# Patient Record
Sex: Female | Born: 1960 | Race: White | Hispanic: No | Smoking: Never smoker
Health system: Southern US, Community
[De-identification: ages and names within clinical notes are randomized; demographics above are authoritative.]

## PROBLEM LIST (undated history)

## (undated) DIAGNOSIS — M1990 Unspecified osteoarthritis, unspecified site: Secondary | ICD-10-CM

## (undated) DIAGNOSIS — J40 Bronchitis, not specified as acute or chronic: Secondary | ICD-10-CM

## (undated) DIAGNOSIS — F32A Depression, unspecified: Secondary | ICD-10-CM

## (undated) DIAGNOSIS — E119 Type 2 diabetes mellitus without complications: Secondary | ICD-10-CM

## (undated) DIAGNOSIS — I729 Aneurysm of unspecified site: Secondary | ICD-10-CM

## (undated) DIAGNOSIS — G4730 Sleep apnea, unspecified: Secondary | ICD-10-CM

## (undated) DIAGNOSIS — I10 Essential (primary) hypertension: Secondary | ICD-10-CM

## (undated) DIAGNOSIS — R519 Headache, unspecified: Secondary | ICD-10-CM

## (undated) DIAGNOSIS — C801 Malignant (primary) neoplasm, unspecified: Secondary | ICD-10-CM

## (undated) DIAGNOSIS — I5189 Other ill-defined heart diseases: Secondary | ICD-10-CM

## (undated) DIAGNOSIS — K760 Fatty (change of) liver, not elsewhere classified: Secondary | ICD-10-CM

## (undated) HISTORY — PX: NASAL SINUS SURGERY: SHX719

## (undated) HISTORY — PX: NOSE SURGERY: SHX723

## (undated) HISTORY — PX: DIAGNOSTIC LAPAROSCOPY: SUR761

## (undated) HISTORY — PX: ABDOMINAL HYSTERECTOMY: SHX81

---

## 2004-03-14 ENCOUNTER — Ambulatory Visit: Payer: Self-pay

## 2004-03-17 ENCOUNTER — Ambulatory Visit: Payer: Self-pay

## 2005-04-24 ENCOUNTER — Ambulatory Visit: Payer: Self-pay

## 2006-05-07 ENCOUNTER — Ambulatory Visit: Payer: Self-pay

## 2007-07-01 ENCOUNTER — Ambulatory Visit: Payer: Self-pay

## 2009-01-20 ENCOUNTER — Ambulatory Visit: Payer: Self-pay

## 2010-04-18 ENCOUNTER — Ambulatory Visit: Payer: Self-pay

## 2014-11-15 ENCOUNTER — Other Ambulatory Visit: Payer: Self-pay | Admitting: Unknown Physician Specialty

## 2014-11-15 ENCOUNTER — Other Ambulatory Visit: Payer: Self-pay | Admitting: *Deleted

## 2014-11-15 ENCOUNTER — Inpatient Hospital Stay
Admission: RE | Admit: 2014-11-15 | Discharge: 2014-11-15 | Disposition: A | Discharge status: Home / Self Care | Payer: Self-pay | Source: Ambulatory Visit | Attending: *Deleted | Admitting: *Deleted

## 2014-11-15 DIAGNOSIS — R928 Other abnormal and inconclusive findings on diagnostic imaging of breast: Secondary | ICD-10-CM

## 2014-12-02 ENCOUNTER — Ambulatory Visit
Admission: RE | Admit: 2014-12-02 | Discharge: 2014-12-02 | Disposition: A | Discharge status: Home / Self Care | Payer: BC Managed Care – PPO | Source: Ambulatory Visit | Attending: Unknown Physician Specialty | Admitting: Unknown Physician Specialty

## 2014-12-02 DIAGNOSIS — N6002 Solitary cyst of left breast: Secondary | ICD-10-CM | POA: Diagnosis not present

## 2014-12-02 DIAGNOSIS — R928 Other abnormal and inconclusive findings on diagnostic imaging of breast: Secondary | ICD-10-CM

## 2014-12-02 HISTORY — DX: Malignant (primary) neoplasm, unspecified: C80.1

## 2015-03-30 ENCOUNTER — Ambulatory Visit: Payer: BC Managed Care – PPO | Attending: Specialist

## 2015-03-30 DIAGNOSIS — G4733 Obstructive sleep apnea (adult) (pediatric): Secondary | ICD-10-CM | POA: Insufficient documentation

## 2016-04-05 ENCOUNTER — Other Ambulatory Visit: Payer: Self-pay | Admitting: Internal Medicine

## 2016-04-05 DIAGNOSIS — Z1231 Encounter for screening mammogram for malignant neoplasm of breast: Secondary | ICD-10-CM

## 2016-04-26 ENCOUNTER — Ambulatory Visit
Admission: RE | Admit: 2016-04-26 | Discharge: 2016-04-26 | Disposition: A | Discharge status: Home / Self Care | Payer: BC Managed Care – PPO | Source: Ambulatory Visit | Attending: Internal Medicine | Admitting: Internal Medicine

## 2016-04-26 DIAGNOSIS — Z1231 Encounter for screening mammogram for malignant neoplasm of breast: Secondary | ICD-10-CM | POA: Diagnosis present

## 2017-01-01 DIAGNOSIS — J189 Pneumonia, unspecified organism: Secondary | ICD-10-CM

## 2017-01-01 HISTORY — DX: Pneumonia, unspecified organism: J18.9

## 2017-02-16 ENCOUNTER — Inpatient Hospital Stay
Admission: EM | Admit: 2017-02-16 | Discharge: 2017-02-17 | DRG: 871 | Disposition: A | Discharge status: Home / Self Care | Payer: BC Managed Care – PPO | Attending: Internal Medicine | Admitting: Internal Medicine

## 2017-02-16 ENCOUNTER — Emergency Department: Payer: BC Managed Care – PPO

## 2017-02-16 ENCOUNTER — Other Ambulatory Visit: Payer: Self-pay

## 2017-02-16 ENCOUNTER — Inpatient Hospital Stay: Payer: BC Managed Care – PPO

## 2017-02-16 DIAGNOSIS — Z886 Allergy status to analgesic agent status: Secondary | ICD-10-CM | POA: Diagnosis not present

## 2017-02-16 DIAGNOSIS — J069 Acute upper respiratory infection, unspecified: Secondary | ICD-10-CM

## 2017-02-16 DIAGNOSIS — J181 Lobar pneumonia, unspecified organism: Secondary | ICD-10-CM | POA: Diagnosis present

## 2017-02-16 DIAGNOSIS — R0602 Shortness of breath: Secondary | ICD-10-CM | POA: Diagnosis present

## 2017-02-16 DIAGNOSIS — Z91012 Allergy to eggs: Secondary | ICD-10-CM | POA: Diagnosis not present

## 2017-02-16 DIAGNOSIS — E860 Dehydration: Secondary | ICD-10-CM | POA: Diagnosis present

## 2017-02-16 DIAGNOSIS — Z6836 Body mass index (BMI) 36.0-36.9, adult: Secondary | ICD-10-CM

## 2017-02-16 DIAGNOSIS — A419 Sepsis, unspecified organism: Secondary | ICD-10-CM

## 2017-02-16 DIAGNOSIS — R0902 Hypoxemia: Secondary | ICD-10-CM | POA: Diagnosis present

## 2017-02-16 DIAGNOSIS — E669 Obesity, unspecified: Secondary | ICD-10-CM | POA: Diagnosis present

## 2017-02-16 DIAGNOSIS — J189 Pneumonia, unspecified organism: Secondary | ICD-10-CM | POA: Diagnosis present

## 2017-02-16 DIAGNOSIS — F329 Major depressive disorder, single episode, unspecified: Secondary | ICD-10-CM | POA: Diagnosis present

## 2017-02-16 DIAGNOSIS — I10 Essential (primary) hypertension: Secondary | ICD-10-CM | POA: Diagnosis present

## 2017-02-16 HISTORY — DX: Essential (primary) hypertension: I10

## 2017-02-16 LAB — LACTIC ACID, PLASMA
LACTIC ACID, VENOUS: 1.6 mmol/L (ref 0.5–1.9)
LACTIC ACID, VENOUS: 2.3 mmol/L — AB (ref 0.5–1.9)

## 2017-02-16 LAB — BASIC METABOLIC PANEL
ANION GAP: 9 (ref 5–15)
BUN: 12 mg/dL (ref 6–20)
CALCIUM: 9.4 mg/dL (ref 8.9–10.3)
CO2: 27 mmol/L (ref 22–32)
Chloride: 102 mmol/L (ref 101–111)
Creatinine, Ser: 0.68 mg/dL (ref 0.44–1.00)
Glucose, Bld: 154 mg/dL — ABNORMAL HIGH (ref 65–99)
Potassium: 3.9 mmol/L (ref 3.5–5.1)
SODIUM: 138 mmol/L (ref 135–145)

## 2017-02-16 LAB — CBC
HCT: 40.6 % (ref 35.0–47.0)
HEMOGLOBIN: 14 g/dL (ref 12.0–16.0)
MCH: 30.7 pg (ref 26.0–34.0)
MCHC: 34.4 g/dL (ref 32.0–36.0)
MCV: 89.4 fL (ref 80.0–100.0)
PLATELETS: 238 10*3/uL (ref 150–440)
RBC: 4.54 MIL/uL (ref 3.80–5.20)
RDW: 13.9 % (ref 11.5–14.5)
WBC: 13.3 10*3/uL — AB (ref 3.6–11.0)

## 2017-02-16 LAB — INFLUENZA PANEL BY PCR (TYPE A & B)
INFLAPCR: NEGATIVE
INFLBPCR: NEGATIVE

## 2017-02-16 LAB — PROTIME-INR
INR: 1.02
Prothrombin Time: 13.3 seconds (ref 11.4–15.2)

## 2017-02-16 LAB — TROPONIN I

## 2017-02-16 LAB — APTT: APTT: 31 s (ref 24–36)

## 2017-02-16 LAB — PROCALCITONIN

## 2017-02-16 MED ORDER — SODIUM CHLORIDE 0.9 % IV SOLN
1.0000 g | INTRAVENOUS | Status: DC
  Administered 2017-02-16: 1 g via INTRAVENOUS
  Filled 2017-02-16 (×2): qty 10

## 2017-02-16 MED ORDER — SODIUM CHLORIDE 0.9 % IV BOLUS (SEPSIS)
1000.0000 mL | Freq: Once | INTRAVENOUS | Status: DC
  Administered 2017-02-16: 1000 mL via INTRAVENOUS

## 2017-02-16 MED ORDER — SODIUM CHLORIDE 0.9 % IV SOLN
INTRAVENOUS | Status: DC
  Administered 2017-02-16: 23:00:00 via INTRAVENOUS

## 2017-02-16 MED ORDER — ONDANSETRON HCL 4 MG/2ML IJ SOLN
4.0000 mg | Freq: Once | INTRAMUSCULAR | Status: AC
  Administered 2017-02-16: 4 mg via INTRAVENOUS
  Filled 2017-02-16: qty 2

## 2017-02-16 MED ORDER — ACETAMINOPHEN 500 MG PO TABS
1000.0000 mg | ORAL_TABLET | ORAL | Status: AC
  Administered 2017-02-16: 1000 mg via ORAL
  Filled 2017-02-16: qty 2

## 2017-02-16 MED ORDER — SODIUM CHLORIDE 0.9 % IV BOLUS (SEPSIS): 1000.0000 mL | Freq: Once | INTRAVENOUS | Status: DC

## 2017-02-16 MED ORDER — LEVOFLOXACIN IN D5W 750 MG/150ML IV SOLN
750.0000 mg | Freq: Once | INTRAVENOUS | Status: AC
  Administered 2017-02-16: 750 mg via INTRAVENOUS
  Filled 2017-02-16: qty 150

## 2017-02-16 MED ORDER — ENOXAPARIN SODIUM 40 MG/0.4ML ~~LOC~~ SOLN
40.0000 mg | SUBCUTANEOUS | Status: DC
  Administered 2017-02-16: 40 mg via SUBCUTANEOUS
  Filled 2017-02-16: qty 0.4

## 2017-02-16 MED ORDER — SODIUM CHLORIDE 0.9 % IV BOLUS (SEPSIS): 500.0000 mL | Freq: Once | INTRAVENOUS | Status: DC

## 2017-02-16 MED ORDER — IPRATROPIUM-ALBUTEROL 0.5-2.5 (3) MG/3ML IN SOLN: 3.0000 mL | RESPIRATORY_TRACT | Status: DC | PRN

## 2017-02-16 MED ORDER — SODIUM CHLORIDE 0.9 % IV SOLN
500.0000 mg | INTRAVENOUS | Status: DC
  Filled 2017-02-16: qty 500

## 2017-02-16 MED ORDER — SODIUM CHLORIDE 0.9 % IV BOLUS (SEPSIS)
1000.0000 mL | Freq: Once | INTRAVENOUS | Status: AC
  Administered 2017-02-16: 1000 mL via INTRAVENOUS

## 2017-02-16 MED ORDER — GUAIFENESIN ER 600 MG PO TB12
600.0000 mg | ORAL_TABLET | Freq: Two times a day (BID) | ORAL | Status: DC
  Administered 2017-02-16 – 2017-02-17 (×2): 600 mg via ORAL
  Filled 2017-02-16 (×2): qty 1

## 2017-02-16 NOTE — ED Notes (Addendum)
Pt from [facility] clinic - was told she needed iv fluids for dehydration. Tested negative for flu but given rx for tamiflu, zofran and prednisone. Pt received iv fluids in triage. And a shot of phenergan at [place].

## 2017-02-16 NOTE — ED Provider Notes (Signed)
[facility] Emergency Department Provider Note   ____________________________________________   First MD Initiated Contact with Patient 02/16/17 1803     (approximate)  I have reviewed the triage vital signs and the nursing notes.   HISTORY  Chief Complaint Dehydration    HPI Sheila Rose is a 57 y.o. female reports a questionable history of COPD being worked up at [facility]  For the last week she has had a cough runny nose productive cough nasal congestion.  This been ongoing for a week but she has had increasing feeling of shortness of breath and generalized fatigue vomited twice in the last 2 days after coughing fits.  She reports he just feels very unwell, lightheaded when she sits up.  We can increase fatigue.  She reports she has had a pneumonia a long time ago and this felt similar.  Reports a mild headache, generalized body aches, fatigue.  Currently reports mild to moderate discomfort.  No recent antibiotics.  Seen in urgent care today and referred here for looking "dehydrated"  Past Medical History:  Diagnosis Date  . Cancer ([place])    skin  . Hypertension     There are no active problems to display for this patient.   Past Surgical History:  Procedure Laterality Date  . ABDOMINAL HYSTERECTOMY    . NOSE SURGERY      Prior to Admission medications   Not on File    Allergies Indomethacin  Family History  Problem Relation Age of Onset  . Breast cancer Maternal Aunt 52  . Breast cancer Paternal Aunt 59    Social History Social History   Tobacco Use  . Smoking status: Never Smoker  Substance Use Topics  . Alcohol use: No    Frequency: Never  . Drug use: Not on file    Review of Systems Constitutional: Fevers and chills eyes: No visual changes. ENT: No sore throat. Cardiovascular: Denies chest pain. Respiratory: See HPI Gastrointestinal: No abdominal pain.  No nausea, no vomiting.  Genitourinary: Negative for  dysuria. Musculoskeletal: Negative for back pain. Skin: Negative for rash. Neurological: Negative for headaches, focal weakness or numbness.    ____________________________________________   PHYSICAL EXAM:  VITAL SIGNS: ED Triage Vitals  Enc Vitals Group     BP 02/16/17 1502 (!) 133/96     Pulse Rate 02/16/17 1502 (!) 114     Resp 02/16/17 1502 18     Temp 02/16/17 1502 100.1 F (37.8 C)     Temp Source 02/16/17 1502 Oral     SpO2 02/16/17 1502 97 %     Weight 02/16/17 1503 224 lb (101.6 kg)     Height 02/16/17 1503 5\' 6"  (1.676 m)     Head Circumference --      Peak Flow --      Pain Score 02/16/17 1502 6     Pain Loc --      Pain Edu? --      Excl. in [place]? --     Constitutional: Alert and oriented.  In no acute distress, but appears diaphoretic, generally ill.  Very pleasant. Eyes: Conjunctivae are normal. Head: Atraumatic. Nose: Mild clear rhinorrhea Mouth/Throat: Mucous membranes are rattly dry Neck: No stridor.   Cardiovascular: Tachycardic rate, regular rhythm. Grossly normal heart sounds.  Good peripheral circulation. Respiratory: Minimal tachypnea.  Clear lung sounds except for some slight rales in the left lower lung fields.  Speaks in full sentences. Gastrointestinal: Soft and nontender. No distention. Musculoskeletal: No  lower extremity tenderness nor edema. Neurologic:  Normal speech and language. No gross focal neurologic deficits are appreciated.  Skin:  Skin is warm, dry and intact. No rash noted. Psychiatric: Mood and affect are normal. Speech and behavior are normal.  ____________________________________________   LABS (all labs ordered are listed, but only abnormal results are displayed)  Labs Reviewed  BASIC METABOLIC PANEL - Abnormal; Notable for the following components:      Result Value   Glucose, Bld 154 (*)    All other components within normal limits  CBC - Abnormal; Notable for the following components:   WBC 13.3 (*)    All other  components within normal limits  CULTURE, BLOOD (ROUTINE X 2)  CULTURE, BLOOD (ROUTINE X 2)  TROPONIN I  LACTIC ACID, PLASMA  LACTIC ACID, PLASMA  INFLUENZA PANEL BY PCR (TYPE A & B)   ____________________________________________  EKG   ____________________________________________  RADIOLOGY    Chest x-ray reviewed, mild opacification over the left lingular region.  Possible atelectasis versus infiltrate ____________________________________________   PROCEDURES  Procedure(s) performed: None  Procedures  Critical Care performed: No  ____________________________________________   INITIAL IMPRESSION / ASSESSMENT AND PLAN / ED COURSE  Pertinent labs & imaging results that were available during my care of the patient were reviewed by me and considered in my medical decision making (see chart for details).  Patient presents for evaluation of fatigue, shortness of breath productive cough.  Noted to be febrile tachycardic, elevated white count.  Chest x-ray and exam suggestive of left lingular pneumonia, rales are noted in the fields.  She is awake and alert, but appears generally ill, consideration of her fever, mild hypoxia with a saturation 86% on room air we will start her on treatment for community-acquired pneumonia she has no noted risk factors for healthcare associated pneumonia, also suspect this may be post viral in nature.  We will retest her flu swab, negative urgent care but her symptoms initially highly suggestive of acute viral illness now leading possibly to pneumonia.  Clinical Course as of Feb 16 1854  Sat Feb 16, 2017  1848 O2 sat noted 86% on room air.  Patient denies increased dyspnea, but placed on 2 L with improvement.  Concern for left lower lobe pneumonia, sepsis.  Patient will be admitted to the hospital.  Lactic acid, blood cultures pending  [MQ]    Clinical Course User Index [MQ] Delman Kitten, MD      ____________________________________________   FINAL CLINICAL IMPRESSION(S) / ED DIAGNOSES  Final diagnoses:  Community acquired pneumonia of left lower lobe of lung ([place])  Sepsis, due to unspecified organism ([place])  Upper respiratory tract infection, unspecified type      NEW MEDICATIONS STARTED DURING THIS VISIT:  New Prescriptions   No medications on file     Note:  This document was prepared using Dragon voice recognition software and may include unintentional dictation errors.     Delman Kitten, MD 02/16/17 2141

## 2017-02-16 NOTE — ED Triage Notes (Signed)
Pt brought over from [facility] walk in and was told she needed IVF for dehydration. Pt states since last Sunday she has had cough, congestion, HA, N&V, HA. Tmax 101 at home. Pt has wet wash cloth to face. Had negative flu at [facility]. Was given IM phenergan at [facility].

## 2017-02-16 NOTE — ED Notes (Signed)
Notified Nurse Manuela Schwartz of assigned bed.

## 2017-02-16 NOTE — Progress Notes (Signed)
CODE SEPSIS - PHARMACY COMMUNICATION  **Broad Spectrum Antibiotics should be administered within 1 hour of Sepsis diagnosis**  Time Code Sepsis Called/Page Received:  02/16 @ 19:00   Antibiotics Ordered:  Levaquin   Time of 1st antibiotic administration:   Additional action taken by pharmacy: Called RN , reminded her to start IV levaquin   If necessary, Name of Provider/Nurse Contacted:     Verdella Laidlaw D ,PharmD Clinical Pharmacist  02/16/2017  7:42 PM

## 2017-02-16 NOTE — H&P (Signed)
[place] [facility] [place] NAME: Sheila Rose    MR#:  [id number]  DATE OF BIRTH:  17-Dec-1960  DATE OF ADMISSION:  02/16/2017  PRIMARY CARE PHYSICIAN: Tanda Rockers, MD   REQUESTING/REFERRING PHYSICIAN:   CHIEF COMPLAINT:   Chief Complaint  Patient presents with  . Dehydration    HISTORY OF PRESENT ILLNESS: Sheila Rose  is a 57 y.o. female with a known history per below, presented with one-week history of not feeling well, generalized weakness, cough, headache, nausea, emesis, fever to 101, seen in [place] [facility]-sent over to the emergency room for acute dehydration/IV fluids, ER workup noted for leukocytosis with pneumonia on chest x-ray, influenza negative at [facility] clinic, patient noted to be hypoxic with O2 saturation in the 80s-breathing comfortable on oxygen, patient evaluated emergency room with family, patient now been admitted for acute community-acquired pneumonia with associated sepsis.  PAST MEDICAL HISTORY:   Past Medical History:  Diagnosis Date  . Cancer ([place])    skin  . Hypertension     PAST SURGICAL HISTORY:  Past Surgical History:  Procedure Laterality Date  . ABDOMINAL HYSTERECTOMY    . NOSE SURGERY      SOCIAL HISTORY:  Social History   Tobacco Use  . Smoking status: Never Smoker  Substance Use Topics  . Alcohol use: No    Frequency: Never    FAMILY HISTORY:  Family History  Problem Relation Age of Onset  . Breast cancer Maternal Aunt 52  . Breast cancer Paternal Aunt 70  HTN  DRUG ALLERGIES:  Allergies  Allergen Reactions  . Eggs Or Egg-Derived Products Other (See Comments)    When it comes to FLU SHOT When it comes to FLU SHOT   . Indomethacin   . Nsaids     REVIEW OF SYSTEMS:   CONSTITUTIONAL: + fever, fatigue, and gen. weakness.  EYES: No blurred or double vision.  EARS, NOSE, AND THROAT: No tinnitus or ear pain.  RESPIRATORY: + cough, shortness of breath, no wheezing or  hemoptysis.  CARDIOVASCULAR: No chest pain, orthopnea, edema.  GASTROINTESTINAL: + nausea, vomiting, no diarrhea or abdominal pain.  GENITOURINARY: No dysuria, hematuria.  ENDOCRINE: No polyuria, nocturia,  HEMATOLOGY: No anemia, easy bruising or bleeding SKIN: No rash or lesion. MUSCULOSKELETAL: No joint pain or arthritis.   NEUROLOGIC: No tingling, numbness, weakness.  PSYCHIATRY: No anxiety or depression.   MEDICATIONS AT HOME:  Prior to Admission medications   Medication Sig Start Date End Date Taking? Authorizing Provider  cyclobenzaprine (FLEXERIL) 10 MG tablet Take 10 mg by mouth at bedtime as needed for muscle spasms. 01/02/17  Yes [provider]  fluticasone (FLONASE) 50 MCG/ACT nasal spray Place 1 spray into both nostrils daily. 02/01/17  Yes [provider]  hydrochlorothiazide (HYDRODIURIL) 25 MG tablet Take 25 mg by mouth daily. 01/02/17  Yes [provider]  sertraline (ZOLOFT) 100 MG tablet Take 100 mg by mouth 2 (two) times daily. 02/01/17  Yes [provider]      PHYSICAL EXAMINATION:   VITAL SIGNS: Blood pressure (!) 133/96, pulse (!) 114, temperature 100.1 F (37.8 C), temperature source Oral, resp. rate 18, height 5\' 6"  (1.676 m), weight 101.6 kg (224 lb), SpO2 97 %.  GENERAL:  57 y.o.-year-old patient lying in the bed with no acute distress.  Obese EYES: Pupils equal, round, reactive to light and accommodation. No scleral icterus. Extraocular muscles intact.  HEENT: Head atraumatic, normocephalic. Oropharynx and nasopharynx clear.  NECK:  Supple, no jugular venous distention. No thyroid enlargement, no tenderness.  LUNGS: Normal breath sounds bilaterally, no wheezing, rales,rhonchi or crepitation. No use of accessory muscles of respiration.  CARDIOVASCULAR: S1, S2 normal. No murmurs, rubs, or gallops.  ABDOMEN: Soft, nontender, nondistended. Bowel sounds present. No organomegaly or mass.  EXTREMITIES: No pedal edema, cyanosis, or  clubbing.  NEUROLOGIC: Cranial nerves II through XII are intact. MAES. Gait not checked.  PSYCHIATRIC: The patient is alert and oriented x 3.  SKIN: No obvious rash, lesion, or ulcer.   LABORATORY PANEL:   CBC Recent Labs  Lab 02/16/17 1503  WBC 13.3*  HGB 14.0  HCT 40.6  PLT 238  MCV 89.4  MCH 30.7  MCHC 34.4  RDW 13.9   ------------------------------------------------------------------------------------------------------------------  Chemistries  Recent Labs  Lab 02/16/17 1503  NA 138  K 3.9  CL 102  CO2 27  GLUCOSE 154*  BUN 12  CREATININE 0.68  CALCIUM 9.4   ------------------------------------------------------------------------------------------------------------------ estimated creatinine clearance is 92.2 mL/min (by C-G formula based on SCr of 0.68 mg/dL). ------------------------------------------------------------------------------------------------------------------ No results for input(s): TSH, T4TOTAL, T3FREE, THYROIDAB in the last 72 hours.  Invalid input(s): FREET3   Coagulation profile No results for input(s): INR, PROTIME in the last 168 hours. ------------------------------------------------------------------------------------------------------------------- No results for input(s): DDIMER in the last 72 hours. -------------------------------------------------------------------------------------------------------------------  Cardiac Enzymes Recent Labs  Lab 02/16/17 1503  TROPONINI <0.03   ------------------------------------------------------------------------------------------------------------------ Invalid input(s): POCBNP  ---------------------------------------------------------------------------------------------------------------  Urinalysis No results found for: COLORURINE, APPEARANCEUR, LABSPEC, PHURINE, GLUCOSEU, HGBUR, BILIRUBINUR, KETONESUR, PROTEINUR, UROBILINOGEN, NITRITE, LEUKOCYTESUR   RADIOLOGY: Dg Chest 2  View  Result Date: 02/16/2017 CLINICAL DATA:  Dehydration with recent cough, congestion, nausea and vomiting with headache and fever. EXAM: CHEST  2 VIEW COMPARISON:  None. FINDINGS: Lungs are adequately inflated with linear density over the left base likely atelectasis. Minimal heterogeneous opacification in the region of the lingula which may be due to additional atelectasis or infection. No evidence of effusion. Cardiomediastinal silhouette and remainder the exam is unremarkable. IMPRESSION: Hypoinflation with mild opacification over the lingula some of which is linear as findings may be all due to atelectasis, although infection is possible. Recommend follow-up to resolution. Electronically Signed   By: Marin Olp M.D.   On: 02/16/2017 15:45    EKG: Orders placed or performed during the hospital encounter of 02/16/17  . ED EKG 12-Lead  . ED EKG 12-Lead    IMPRESSION AND PLAN: 1 acute sepsis secondary to community acquired pneumonia Start sepsis protocol, IV fluids for rehydration, follow-up on cultures  2 acute left-sided community-acquired pneumonia Start pneumonia protocol, empiric Rocephin/azithromycin, follow-up on cultures  3 chronic obstructive sleep apnea Stable Continue CPAP  4 incomplete MAR Complete when available  5 chronic benign essential hypertension Complete MAR when available, hydralazine IV as needed systolic blood pressure greater than 160, vitals per routine, make changes as per necessary  Full code Condition stable Disposition Home in 2-3 days barring any complications  All the records are reviewed and case discussed with ED provider. Management plans discussed with the patient, family and they are in agreement.  CODE STATUS: Code Status History    This patient does not have a recorded code status. Please follow your organizational policy for patients in this situation.       TOTAL TIME TAKING CARE OF THIS PATIENT: 45 minutes.    Avel Peace  Salary M.D on 02/16/2017   Between 7am to 6pm - Pager - [phone]  After 6pm go to [email or web address] - password  [email or web address]  [facility]  Office  [phone]  CC: Primary care physician; Tanda Rockers, MD   Note: This dictation was prepared with Dragon dictation along with smaller phrase technology. Any transcriptional errors that result from this process are unintentional.

## 2017-02-17 LAB — LACTIC ACID, PLASMA
Lactic Acid, Venous: 0.8 mmol/L (ref 0.5–1.9)
Lactic Acid, Venous: 1 mmol/L (ref 0.5–1.9)

## 2017-02-17 MED ORDER — TOPIRAMATE 25 MG PO TABS
50.0000 mg | ORAL_TABLET | Freq: Two times a day (BID) | ORAL | Status: DC
  Administered 2017-02-17: 50 mg via ORAL
  Filled 2017-02-17: qty 2

## 2017-02-17 MED ORDER — ACETAMINOPHEN 325 MG PO TABS
650.0000 mg | ORAL_TABLET | Freq: Four times a day (QID) | ORAL | Status: DC | PRN
  Administered 2017-02-17: 650 mg via ORAL
  Filled 2017-02-17: qty 2

## 2017-02-17 MED ORDER — CEFUROXIME AXETIL 500 MG PO TABS: 500.0000 mg | ORAL_TABLET | Freq: Two times a day (BID) | ORAL | 0 refills | Status: DC

## 2017-02-17 NOTE — Progress Notes (Signed)
Patient to start cpap tonight

## 2017-02-17 NOTE — Progress Notes (Signed)
Toshie A Gutt  A and O x 4. VSS. Pt tolerating diet well. No complaints of pain or nausea. IV removed intact, prescriptions given. Pt voiced understanding of discharge instructions with no further questions. Pt discharged via wheelchair with axillary.    Allergies as of 02/17/2017      Reactions   Eggs Or Egg-derived Products Other (See Comments)   When it comes to FLU SHOT When it comes to FLU SHOT   Indomethacin    Nsaids       Medication List    TAKE these medications   cefUROXime 500 MG tablet Commonly known as:  CEFTIN Take 1 tablet (500 mg total) by mouth 2 (two) times daily with a meal.   cyclobenzaprine 10 MG tablet Commonly known as:  FLEXERIL Take 10 mg by mouth at bedtime as needed for muscle spasms.   fluticasone 50 MCG/ACT nasal spray Commonly known as:  FLONASE Place 1 spray into both nostrils daily.   hydrochlorothiazide 25 MG tablet Commonly known as:  HYDRODIURIL Take 25 mg by mouth daily.   multivitamin tablet Take 1 tablet by mouth daily.   sertraline 100 MG tablet Commonly known as:  ZOLOFT Take 100 mg by mouth 2 (two) times daily.   vitamin B-12 1000 MCG tablet Commonly known as:  CYANOCOBALAMIN Take 1,000 mcg by mouth daily.   Vitamin D (Ergocalciferol) 50000 units Caps capsule Commonly known as:  DRISDOL Take 50,000 Units by mouth every Friday.       Vitals:   02/17/17 0435 02/17/17 1120  BP: 132/63   Pulse: 92   Resp: 20   Temp: 98 F (36.7 C)   SpO2: 94% 92%    Francesco Sor

## 2017-02-17 NOTE — Discharge Summary (Signed)
[facility] NAME: Sheila Rose    MR#:  [id number]  DATE OF BIRTH:  [birth year]  DATE OF ADMISSION:  02/16/2017 ADMITTING PHYSICIAN: Gorden Harms, MD  DATE OF DISCHARGE: 02/17/2017  PRIMARY CARE PHYSICIAN: Tanda Rockers, MD    ADMISSION DIAGNOSIS:  Sepsis ([place]) [A41.9] Sepsis, due to unspecified organism [facility]) [A41.9] Upper respiratory tract infection, unspecified type [J06.9] Community acquired pneumonia of left lower lobe of lung ([place]) [J18.1]  DISCHARGE DIAGNOSIS:  Active Problems:   CAP (community acquired pneumonia)   SECONDARY DIAGNOSIS:   Past Medical History:  Diagnosis Date  . Cancer ([place])    skin  . Hypertension     HOSPITAL COURSE:  57 year-old female with hypertension who presents with shortness of breath.  1. Sepsis: Patient presents with tachycardia, fever and leukocytosis Sepsis has resolved. Lactic acid has improved. Sepsis is due to community-acquired pneumonia  2. Community-acquired pneumonia: Patient was on Rocephin and azithromycin and will be transitioned to oral Ceftin.  3. Essential hypertension: Patient will resume outpatient regimen of HCTZ  4. Depression: Continue Zoloft   DISCHARGE CONDITIONS AND DIET:   Stable for discharge on regular diet  CONSULTS OBTAINED:    DRUG ALLERGIES:   Allergies  Allergen Reactions  . Eggs Or Egg-Derived Products Other (See Comments)    When it comes to FLU SHOT When it comes to FLU SHOT   . Indomethacin   . Nsaids     DISCHARGE MEDICATIONS:   Allergies as of 02/17/2017      Reactions   Eggs Or Egg-derived Products Other (See Comments)   When it comes to FLU SHOT When it comes to FLU SHOT   Indomethacin    Nsaids       Medication List    TAKE these medications   cefUROXime 500 MG tablet Commonly known as:  CEFTIN Take 1 tablet (500 mg total) by mouth 2 (two) times daily with a meal.   cyclobenzaprine 10 MG tablet Commonly  known as:  FLEXERIL Take 10 mg by mouth at bedtime as needed for muscle spasms.   fluticasone 50 MCG/ACT nasal spray Commonly known as:  FLONASE Place 1 spray into both nostrils daily.   hydrochlorothiazide 25 MG tablet Commonly known as:  HYDRODIURIL Take 25 mg by mouth daily.   multivitamin tablet Take 1 tablet by mouth daily.   sertraline 100 MG tablet Commonly known as:  ZOLOFT Take 100 mg by mouth 2 (two) times daily.   vitamin B-12 1000 MCG tablet Commonly known as:  CYANOCOBALAMIN Take 1,000 mcg by mouth daily.   Vitamin D (Ergocalciferol) 50000 units Caps capsule Commonly known as:  DRISDOL Take 50,000 Units by mouth every Friday.         Today   CHIEF COMPLAINT:   Patient has improved symptoms   VITAL SIGNS:  Blood pressure 132/63, pulse 92, temperature 98 F (36.7 C), temperature source Oral, resp. rate 20, height 5\' 6"  (1.676 m), weight 101.6 kg (224 lb), SpO2 94 %.   REVIEW OF SYSTEMS:  Review of Systems  Constitutional: Negative for chills, fever and malaise/fatigue.  HENT: Negative.  Negative for ear discharge, ear pain, hearing loss, nosebleeds and sore throat.   Eyes: Negative.  Negative for blurred vision and pain.  Respiratory: Positive for cough. Negative for hemoptysis, shortness of breath and wheezing.   Cardiovascular: Negative.  Negative for chest pain, palpitations and leg swelling.  Gastrointestinal: Negative.  Negative for  abdominal pain, blood in stool, diarrhea, nausea and vomiting.  Genitourinary: Negative.  Negative for dysuria.  Musculoskeletal: Negative.  Negative for back pain.  Skin: Negative.   Neurological: Positive for weakness. Negative for dizziness, tremors, speech change, focal weakness, seizures and headaches.  Endo/Heme/Allergies: Negative.  Does not bruise/bleed easily.  Psychiatric/Behavioral: Negative.  Negative for depression, hallucinations and suicidal ideas.     PHYSICAL EXAMINATION:  GENERAL:  57  y.o.-year-old patient lying in the bed with no acute distress.  NECK:  Supple, no jugular venous distention. No thyroid enlargement, no tenderness.  LUNGS: Normal breath sounds bilaterally, no wheezing, rales,rhonchi  No use of accessory muscles of respiration.  CARDIOVASCULAR: S1, S2 normal. No murmurs, rubs, or gallops.  ABDOMEN: Soft, non-tender, non-distended. Bowel sounds present. No organomegaly or mass.  EXTREMITIES: No pedal edema, cyanosis, or clubbing.  PSYCHIATRIC: The patient is alert and oriented x 3.  SKIN: No obvious rash, lesion, or ulcer.   DATA REVIEW:   CBC Recent Labs  Lab 02/16/17 1503  WBC 13.3*  HGB 14.0  HCT 40.6  PLT 238    Chemistries  Recent Labs  Lab 02/16/17 1503  NA 138  K 3.9  CL 102  CO2 27  GLUCOSE 154*  BUN 12  CREATININE 0.68  CALCIUM 9.4    Cardiac Enzymes Recent Labs  Lab 02/16/17 1503  TROPONINI <0.03    Microbiology Results  @MICRORSLT48 @  RADIOLOGY:  Dg Chest 2 View  Result Date: 02/16/2017 CLINICAL DATA:  Dehydration with recent cough, congestion, nausea and vomiting with headache and fever. EXAM: CHEST  2 VIEW COMPARISON:  None. FINDINGS: Lungs are adequately inflated with linear density over the left base likely atelectasis. Minimal heterogeneous opacification in the region of the lingula which may be due to additional atelectasis or infection. No evidence of effusion. Cardiomediastinal silhouette and remainder the exam is unremarkable. IMPRESSION: Hypoinflation with mild opacification over the lingula some of which is linear as findings may be all due to atelectasis, although infection is possible. Recommend follow-up to resolution. Electronically Signed   By: Marin Olp M.D.   On: 02/16/2017 15:45      Allergies as of 02/17/2017      Reactions   Eggs Or Egg-derived Products Other (See Comments)   When it comes to FLU SHOT When it comes to FLU SHOT   Indomethacin    Nsaids       Medication List    TAKE these  medications   cefUROXime 500 MG tablet Commonly known as:  CEFTIN Take 1 tablet (500 mg total) by mouth 2 (two) times daily with a meal.   cyclobenzaprine 10 MG tablet Commonly known as:  FLEXERIL Take 10 mg by mouth at bedtime as needed for muscle spasms.   fluticasone 50 MCG/ACT nasal spray Commonly known as:  FLONASE Place 1 spray into both nostrils daily.   hydrochlorothiazide 25 MG tablet Commonly known as:  HYDRODIURIL Take 25 mg by mouth daily.   multivitamin tablet Take 1 tablet by mouth daily.   sertraline 100 MG tablet Commonly known as:  ZOLOFT Take 100 mg by mouth 2 (two) times daily.   vitamin B-12 1000 MCG tablet Commonly known as:  CYANOCOBALAMIN Take 1,000 mcg by mouth daily.   Vitamin D (Ergocalciferol) 50000 units Caps capsule Commonly known as:  DRISDOL Take 50,000 Units by mouth every Friday.          Management plans discussed with the patient and she is in agreement. Stable for  discharge home  Patient should follow up with pcp  CODE STATUS:     Code Status Orders  (From admission, onward)        Start     Ordered   02/16/17 2135  Full code  Continuous     02/16/17 2134    Code Status History    Date Active Date Inactive Code Status Order ID Comments User Context   This patient has a current code status but no historical code status.      TOTAL TIME TAKING CARE OF THIS PATIENT: 38 minutes.    Note: This dictation was prepared with Dragon dictation along with smaller phrase technology. Any transcriptional errors that result from this process are unintentional.  Daeja Helderman M.D on 02/17/2017 at 11:07 AM  Between 7am to 6pm - Pager - [phone] After 6pm go to [email or web address] - password [email or web address] [facility]  Office  [phone]  CC: Primary care physician; Tanda Rockers, MD

## 2017-02-19 LAB — HIV ANTIBODY (ROUTINE TESTING W REFLEX): HIV SCREEN 4TH GENERATION: NONREACTIVE

## 2017-02-21 LAB — CULTURE, BLOOD (ROUTINE X 2)
CULTURE: NO GROWTH
Culture: NO GROWTH
SPECIAL REQUESTS: ADEQUATE

## 2017-04-16 ENCOUNTER — Other Ambulatory Visit: Payer: Self-pay | Admitting: Internal Medicine

## 2017-04-16 DIAGNOSIS — Z1231 Encounter for screening mammogram for malignant neoplasm of breast: Secondary | ICD-10-CM

## 2017-05-17 ENCOUNTER — Ambulatory Visit
Admission: RE | Admit: 2017-05-17 | Discharge: 2017-05-17 | Disposition: A | Discharge status: Home / Self Care | Payer: BC Managed Care – PPO | Source: Ambulatory Visit | Attending: Internal Medicine | Admitting: Internal Medicine

## 2017-05-17 DIAGNOSIS — Z1231 Encounter for screening mammogram for malignant neoplasm of breast: Secondary | ICD-10-CM

## 2017-07-26 ENCOUNTER — Ambulatory Visit: Payer: BC Managed Care – PPO | Admitting: Dietician

## 2017-08-13 ENCOUNTER — Telehealth: Payer: Self-pay | Admitting: Dietician

## 2017-08-13 ENCOUNTER — Encounter: Payer: Self-pay | Admitting: Dietician

## 2017-08-13 ENCOUNTER — Encounter: Payer: BC Managed Care – PPO | Attending: Internal Medicine | Admitting: Dietician

## 2017-08-13 VITALS — Ht 65.0 in | Wt 225.2 lb

## 2017-08-13 DIAGNOSIS — I10 Essential (primary) hypertension: Secondary | ICD-10-CM | POA: Diagnosis not present

## 2017-08-13 DIAGNOSIS — R0609 Other forms of dyspnea: Secondary | ICD-10-CM | POA: Diagnosis not present

## 2017-08-13 DIAGNOSIS — E669 Obesity, unspecified: Secondary | ICD-10-CM | POA: Diagnosis not present

## 2017-08-13 NOTE — Progress Notes (Signed)
Medical Nutrition Therapy: Visit start time: 1330 end time: 1440 Assessment:  Diagnosis: hypertension, obesity, other forms of dyspnea Past medical history:hyperlipidemia, sleep apnea, fibromyalgia, migraine Psychosocial issues/ stress concerns: Pt. Rates her stress as "low".   Preferred learning method:  . Visual Current weight: 225.2lbs Height: 65 in Medications, supplements: see list; takes Vit.D, B12,multi-vitamin and fish oil  Progress and evaluation:  Patient in for initial medical nutrition therapy appointment. She reports a strong family history of diabetes and heart disease. She states she has gained approximately 20 lbs in past year. Gives history of 40 lbs of weight loss in 2010 through diet and consistent exercise. 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 Her most recent lipid profile- TChol-235 (H), Trig.-305 (H), LDL 132 (H), Glucose-122 (unsure if it was fasting). More recently, she has made positive diet changes. She is making lower fat choices and has increased her vegetable intake.  Physical activity: no structured exercise  Dietary Intake:  Usual eating pattern includes 3 meals and 1-2 snacks per day. Dining out frequency:2-4 meals per week.  Breakfast: 7:30- oatmeal, fruit, 1 cup coffee with sweetened creamer or [place] [employer] [place] sausage/egg white sandwich  Lunch: 12:00- meat sandwich, baked chips or salad or cottage cheese and fruit.If eats "out", usually chooses a higher fat meal. Supper: 6-8:00pm- salads with tuna or chicken or [place] burger/baked beans or chicken with a variety of vegetables  Snack: ice cream or yogurt or popcorn Beverages: water 6-7cups daily, sweet tea, coffee (1 cup)  Nutrition Care Education:  Weight control:  Instructed on a meal plan based on 1600 calories including identifying carbohydrates, portion  control and how to better balance carbohydrate, protein and non-starchy vegetables. Encouraged to use her love of vegetables to her advantage to increase fiber and decrease portions of starch and fat at meals. Gave and reviewed sample menu.  Hypertension:  Instructed on sodium recommendations and ways to decrease in her diet. Hyperlipidemia:  target goals for lipids, healthy and unhealthy fats, role of fiber, recommendations for saturated fat and ways to lower in her diet; label reading related to saturated fat, sodium and carbohydrate.   Nutritional Diagnosis:  -3.3 Overweight/obesity As related to history of higher fat food choices and lack of structured exercise .  As evidenced by diet/exercise history.  Intervention:  Balance meals with 2-4 oz lean protein, 2-3 servings of carbohydrate and non-starchy vegetables.  Refer to food guide plate for diabetes and "Planning a Balanced Meal" Read labels for carbohydrate, saturated fat, and sodium. Guideline for  sodium is less than 1500 mg daily. Guideline for  saturated fat is less than 12 gms daily. Limit sugar sweetened beverages. Exercise- start with 10 minutes daily   Education Materials given:  . Plate Planner . Food lists/ Planning A Balanced Meal . Sample meal pattern/ menus . Goals/ instructions Learner/ who was taught:  . Patient  Level of understanding: . Partial understanding; needs review/ practice  Demonstrated degree of understanding via:   Teach back Learning barriers: . None Willingness to learn/ readiness for change: . Eager, change in progress  Monitoring and Evaluation:  Dietary intake, exercise,  and body weight      follow up: 09/10/17 at 1:15

## 2017-08-13 NOTE — Patient Instructions (Signed)
Balance meals with 2-4 oz lean protein, 2-3 servings of carbohydrate and non-starchy vegetables.  Refer to food guide plate for diabetes and "Planning a Balanced Meal" Read labels for carbohydrate, saturated fat, and sodium. Guideline for  sodium is less than 1500 mg daily. Guideline for  saturated fat is less than 12 gms daily. Limit sugar sweetened beverages. Exercise- start with 10 minutes daily

## 2017-08-13 NOTE — Telephone Encounter (Signed)
Called patient to clarify if MD had given her any instructions as to whether there were exercise limitations. Left message asking that she return call.

## 2017-08-15 ENCOUNTER — Telehealth: Payer: Self-pay | Admitting: Dietician

## 2017-08-15 NOTE — Telephone Encounter (Signed)
Called patient to clarify regarding her doctor's instruction regarding exercise and she stated she does not have any restrictions.

## 2017-09-10 ENCOUNTER — Ambulatory Visit: Payer: BC Managed Care – PPO | Admitting: Dietician

## 2017-09-26 ENCOUNTER — Encounter: Payer: Self-pay | Admitting: Dietician

## 2017-09-26 NOTE — Progress Notes (Signed)
Sent discharge letter to Dr. Eustace Quail.

## 2018-06-05 ENCOUNTER — Ambulatory Visit: Payer: BC Managed Care – PPO | Attending: Neurology

## 2018-06-05 DIAGNOSIS — G4730 Sleep apnea, unspecified: Secondary | ICD-10-CM | POA: Insufficient documentation

## 2018-06-06 ENCOUNTER — Other Ambulatory Visit: Payer: Self-pay

## 2018-07-18 ENCOUNTER — Other Ambulatory Visit: Payer: Self-pay

## 2018-07-18 ENCOUNTER — Other Ambulatory Visit
Admission: RE | Admit: 2018-07-18 | Discharge: 2018-07-18 | Disposition: A | Discharge status: Home / Self Care | Payer: BC Managed Care – PPO | Source: Ambulatory Visit | Attending: Internal Medicine | Admitting: Internal Medicine

## 2018-07-18 DIAGNOSIS — Z1159 Encounter for screening for other viral diseases: Secondary | ICD-10-CM | POA: Insufficient documentation

## 2018-07-18 LAB — SARS CORONAVIRUS 2 (TAT 6-24 HRS): SARS Coronavirus 2: NEGATIVE

## 2018-07-22 ENCOUNTER — Ambulatory Visit: Payer: BC Managed Care – PPO | Attending: Neurology

## 2018-07-22 DIAGNOSIS — G4733 Obstructive sleep apnea (adult) (pediatric): Secondary | ICD-10-CM | POA: Insufficient documentation

## 2018-07-22 DIAGNOSIS — F5101 Primary insomnia: Secondary | ICD-10-CM | POA: Insufficient documentation

## 2018-07-23 ENCOUNTER — Ambulatory Visit: Payer: BC Managed Care – PPO

## 2018-07-24 ENCOUNTER — Other Ambulatory Visit: Payer: Self-pay

## 2018-08-05 ENCOUNTER — Other Ambulatory Visit: Payer: Self-pay | Admitting: Internal Medicine

## 2018-08-05 DIAGNOSIS — Z1231 Encounter for screening mammogram for malignant neoplasm of breast: Secondary | ICD-10-CM

## 2018-09-05 ENCOUNTER — Ambulatory Visit
Admission: RE | Admit: 2018-09-05 | Discharge: 2018-09-05 | Disposition: A | Discharge status: Home / Self Care | Payer: BC Managed Care – PPO | Source: Ambulatory Visit | Attending: Internal Medicine | Admitting: Internal Medicine

## 2018-09-05 DIAGNOSIS — Z1231 Encounter for screening mammogram for malignant neoplasm of breast: Secondary | ICD-10-CM

## 2019-03-19 ENCOUNTER — Ambulatory Visit: Payer: BC Managed Care – PPO

## 2019-03-20 ENCOUNTER — Ambulatory Visit: Payer: BC Managed Care – PPO | Attending: Internal Medicine

## 2019-03-20 DIAGNOSIS — Z23 Encounter for immunization: Secondary | ICD-10-CM

## 2019-03-20 NOTE — Progress Notes (Signed)
   Covid-19 Vaccination Clinic  Name:  STEPHANINE BEK    MRN: [id number] DOB: [birth year]  03/20/2019  Ms. Galmore was observed post Covid-19 immunization for 15 minutes without incident. She was provided with Vaccine Information Sheet and instruction to access the V-Safe system.   Ms. Gustave was instructed to call 911 with any severe reactions post vaccine: Marland Kitchen Difficulty breathing  . Swelling of face and throat  . A fast heartbeat  . A bad rash all over body  . Dizziness and weakness   Immunizations Administered    Name Date Dose VIS Date Route   Pfizer COVID-19 Vaccine 03/20/2019 10:56 AM 0.3 mL 12/12/2018 Intramuscular   Manufacturer: [place]   Lot: [id number]   [place]: [id number]

## 2019-04-14 ENCOUNTER — Ambulatory Visit: Payer: BC Managed Care – PPO | Attending: Internal Medicine

## 2019-04-14 DIAGNOSIS — Z23 Encounter for immunization: Secondary | ICD-10-CM

## 2019-04-14 NOTE — Progress Notes (Signed)
   Covid-19 Vaccination Clinic  Name:  Sheila Rose    MRN: [id number] DOB: 09/30/1960  04/14/2019  [name] was observed post Covid-19 immunization for 15 minutes without incident. She was provided with Vaccine Information Sheet and instruction to access the V-Safe system.   [name] was instructed to call 911 with any severe reactions post vaccine: Marland Kitchen Difficulty breathing  . Swelling of face and throat  . A fast heartbeat  . A bad rash all over body  . Dizziness and weakness   Immunizations Administered    Name Date Dose VIS Date Route   Pfizer COVID-19 Vaccine 04/14/2019 11:32 AM 0.3 mL 12/12/2018 Intramuscular   Manufacturer: [place]   Lot: [id number]   [place]: [id number]

## 2019-08-13 ENCOUNTER — Other Ambulatory Visit: Payer: Self-pay | Admitting: Internal Medicine

## 2019-08-13 DIAGNOSIS — Z1231 Encounter for screening mammogram for malignant neoplasm of breast: Secondary | ICD-10-CM

## 2019-09-29 ENCOUNTER — Other Ambulatory Visit: Payer: Self-pay

## 2019-09-29 ENCOUNTER — Ambulatory Visit
Admission: RE | Admit: 2019-09-29 | Discharge: 2019-09-29 | Disposition: A | Discharge status: Home / Self Care | Payer: BC Managed Care – PPO | Source: Ambulatory Visit | Attending: Internal Medicine | Admitting: Internal Medicine

## 2019-09-29 DIAGNOSIS — Z1231 Encounter for screening mammogram for malignant neoplasm of breast: Secondary | ICD-10-CM | POA: Diagnosis present

## 2020-11-02 ENCOUNTER — Other Ambulatory Visit: Payer: Self-pay | Admitting: Internal Medicine

## 2020-11-02 DIAGNOSIS — Z1231 Encounter for screening mammogram for malignant neoplasm of breast: Secondary | ICD-10-CM

## 2021-01-13 ENCOUNTER — Ambulatory Visit
Admission: RE | Admit: 2021-01-13 | Discharge: 2021-01-13 | Disposition: A | Discharge status: Home / Self Care | Payer: BC Managed Care – PPO | Source: Ambulatory Visit | Attending: Internal Medicine | Admitting: Internal Medicine

## 2021-01-13 ENCOUNTER — Other Ambulatory Visit: Payer: Self-pay

## 2021-01-13 DIAGNOSIS — Z1231 Encounter for screening mammogram for malignant neoplasm of breast: Secondary | ICD-10-CM | POA: Diagnosis present

## 2021-01-16 ENCOUNTER — Other Ambulatory Visit: Payer: Self-pay | Admitting: Internal Medicine

## 2021-01-23 ENCOUNTER — Other Ambulatory Visit: Payer: Self-pay | Admitting: Internal Medicine

## 2021-01-23 DIAGNOSIS — R928 Other abnormal and inconclusive findings on diagnostic imaging of breast: Secondary | ICD-10-CM

## 2021-01-23 DIAGNOSIS — N6310 Unspecified lump in the right breast, unspecified quadrant: Secondary | ICD-10-CM

## 2021-01-24 ENCOUNTER — Other Ambulatory Visit: Payer: Self-pay | Admitting: Family Medicine

## 2021-01-24 ENCOUNTER — Ambulatory Visit
Admission: RE | Admit: 2021-01-24 | Discharge: 2021-01-24 | Disposition: A | Discharge status: Home / Self Care | Payer: BC Managed Care – PPO | Source: Ambulatory Visit | Attending: Internal Medicine | Admitting: Internal Medicine

## 2021-01-24 ENCOUNTER — Other Ambulatory Visit: Payer: Self-pay

## 2021-01-24 DIAGNOSIS — R928 Other abnormal and inconclusive findings on diagnostic imaging of breast: Secondary | ICD-10-CM | POA: Insufficient documentation

## 2021-01-24 DIAGNOSIS — N6310 Unspecified lump in the right breast, unspecified quadrant: Secondary | ICD-10-CM | POA: Insufficient documentation

## 2021-01-30 ENCOUNTER — Other Ambulatory Visit: Payer: Self-pay | Admitting: Internal Medicine

## 2021-01-30 DIAGNOSIS — R928 Other abnormal and inconclusive findings on diagnostic imaging of breast: Secondary | ICD-10-CM

## 2021-01-30 DIAGNOSIS — N6310 Unspecified lump in the right breast, unspecified quadrant: Secondary | ICD-10-CM

## 2021-02-07 ENCOUNTER — Ambulatory Visit: Payer: BC Managed Care – PPO | Admitting: Dermatology

## 2021-02-08 ENCOUNTER — Other Ambulatory Visit: Payer: Self-pay | Admitting: Internal Medicine

## 2021-02-08 ENCOUNTER — Other Ambulatory Visit: Payer: Self-pay

## 2021-02-08 ENCOUNTER — Ambulatory Visit
Admission: RE | Admit: 2021-02-08 | Discharge: 2021-02-08 | Disposition: A | Discharge status: Home / Self Care | Payer: BC Managed Care – PPO | Source: Ambulatory Visit | Attending: Internal Medicine | Admitting: Internal Medicine

## 2021-02-08 DIAGNOSIS — R928 Other abnormal and inconclusive findings on diagnostic imaging of breast: Secondary | ICD-10-CM

## 2021-02-08 DIAGNOSIS — N6310 Unspecified lump in the right breast, unspecified quadrant: Secondary | ICD-10-CM

## 2021-02-08 HISTORY — PX: BREAST BIOPSY: SHX20

## 2021-02-09 LAB — SURGICAL PATHOLOGY

## 2021-02-10 NOTE — Progress Notes (Signed)
Received radiologist recommendation for surgical consultation from Electa Sniff at [facility].  Right breast pathology: intraductal papilloma.  Discussed with patient.  She is scheduled with Dr. Lysle Pearl on 02/14/21 at 10:00.

## 2021-02-14 ENCOUNTER — Other Ambulatory Visit: Payer: Self-pay | Admitting: Surgery

## 2021-02-14 DIAGNOSIS — N6099 Unspecified benign mammary dysplasia of unspecified breast: Secondary | ICD-10-CM

## 2021-02-15 ENCOUNTER — Ambulatory Visit: Payer: Self-pay | Admitting: Surgery

## 2021-02-15 NOTE — H&P (View-Only) (Signed)
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 female who was referred by Self for evaluation of above. Change was noted on last screening mammogram. Patient does not routinely do self breast exams.  Patient has not noted a change on breast exam. Age of menarche was 10.s/p hysterectomy. Patient reports hormonal therapy for 10years. Patient is G2P2. Age of first live birth was 21. Patient did breast feed. Patient denies nipple discharge. Patient denies previous breast biopsy. Patient denies a personal history of breast cancer. °  °Mother with hx of ovarian Cancer.  Pt was tested for BRCA and was negative. ° °Past Medical History:  has a past medical history of History of depression, endometriosis (1977-2001), Hyperlipemia (2017), Hypertension, Migraines, Osteopenia (2015), UI (urinary incontinence), Vitamin B12 deficiency, and Vitamin D deficiency. ° °Past Surgical History:  has a past surgical history that includes Vaginal hysterectomy (2001); Excision Nasal Polyps; Colonoscopy (2012); and Colonoscopy (05/01/2017). ° °Family History: family history includes Asthma in her mother; Atrial fibrillation (Abnormal heart rhythm sometimes requiring treatment with blood thinners) in her maternal grandmother; Breast cancer in her maternal aunt and paternal aunt; COPD in her maternal grandmother and maternal uncle; Colon cancer in her paternal grandmother; Congenital heart disease in her maternal grandmother; Diabetes in her maternal grandfather, maternal grandmother, maternal uncle, mother, paternal grandmother, and paternal uncle; Emphysema in her maternal uncle; Heart disease in her mother; Hernia in her mother; High blood pressure (Hypertension) in her maternal grandmother and mother; Hyperlipidemia (Elevated cholesterol) in her mother; Hypothyroidism in her maternal aunt, maternal grandmother, maternal uncle, and mother; Melanoma in her father; Myocardial Infarction (Heart  attack) in her maternal grandmother; Osteoarthritis in her father, maternal aunt, maternal grandfather, maternal grandmother, and maternal uncle; Osteoporosis (Thinning of bones) in her maternal grandmother and mother; Ovarian cancer in her maternal grandmother and mother; Rheum arthritis in her mother; Stroke in her maternal grandfather and maternal grandmother; Urinary Incontinence in her mother. ° °Social History:  reports that she has never smoked. She has never used smokeless tobacco. She reports that she does not drink alcohol and does not use drugs. ° °Current Medications: has a current medication list which includes the following prescription(s): acetaminophen, cyanocobalamin, cyclobenzaprine, ergocalciferol (vitamin d2), fluticasone propionate, jardiance, ketoconazole, multivitamin, sertraline, wegovy, and hydrochlorothiazide. ° °Allergies:  °Allergies as of 02/14/2021 - Reviewed 02/14/2021 °Allergen Reaction Noted ° Egg Other (See Comments) 06/30/2012 ° Indomethacin Unknown 02/16/2017 ° Nsaids (non-steroidal anti-inflammatory drug) Other (See Comments) 02/14/2021 ° ° °ROS:  °A 15 point review of systems was performed and was negative except as noted in HPI °  °Objective: °  ° °BP 131/84    Pulse (!) 117    Ht 167.6 cm (5' 6")    Wt (!) 102.1 kg (225 lb)    BMI 36.32 kg/m²  ° °Constitutional :  No distress, cooperative, alert °Lymphatics/Throat:  Supple with no lymphadenopathy °Respiratory:  Clear to auscultation bilaterally °Cardiovascular:  Regular rate and rhythm °Gastrointestinal: Soft, non-tender, non-distended, no organomegaly. °Musculoskeletal: Steady gait and movement °Skin: Cool and moist, no surgical scars °Psychiatric: Normal affect, non-agitated, not confused °Breast: Normal appearance and no palpable abnormality in bilateral breasts and axilla.  Chaperone present for exam. °  °  °LABS:  °SURGICAL PATHOLOGY  SURGICAL PATHOLOGY  °CASE: [id number]  °PATIENT: Sheila Rose  °Surgical Pathology  Report  ° ° ° ° °Specimen Submitted:  °A. Breast, right  ° °Clinical History: Venus clip within site of biopsy in right upper   outer  quadrant.   Hemorrhagic cyst.       DIAGNOSIS:  A. RIGHT BREAST, 10:00 2 CMFN; ULTRASOUND-GUIDED BIOPSY:  - INTRADUCTAL PAPILLOMA WITH FOCAL ATYPIA.  - NEGATIVE FOR MALIGNANCY.   Comment:  While there is no evidence of malignancy in this sample, a more  clinically significant lesion cannot be completely excluded based on a  core biopsy.  Complete excision of the lesion is recommended.    GROSS DESCRIPTION:  A. Labeled: Right breast 10:00 2 cm from nipple  Received: Formalin  Time/date in fixative: Collected and placed in formalin at 1:41 PM on  02/08/2021  Cold ischemic time: Less than 1 minute  Total fixation time: Approximately 6.75 hours  Core pieces: 3 cores and multiple additional fragments  Size: Range from 0.5-0.8 cm in length and 0.2 cm in diameter  Description: Received are cores and fragments of yellow hemorrhagic  fibrofatty tissue.  The additional fragments are 0.9 x 0.2 x 0.1 cm in  aggregate.  Ink color: Blue  Entirely submitted in cassettes 1-2 with 2 cores in cassette 1 and 1  core with the remaining fragments in cassette 2.   RB 02/08/2021   Final Diagnosis performed by Betsy Pries, MD.   Electronically signed  02/09/2021 1:26:46PM  The electronic signature indicates that the named Attending Pathologist  has evaluated the specimen  Technical component performed at [place], [address] [place],  [place] [address] Lab: [phone] Dir: Rush Farmer, MD, MMM   Professional component performed at [facility], [place], [place], [place] [address] Lab: [phone]  Dir: Kathi Simpers, MD     RADS: This result has an attachment that is not available.  CLINICAL DATA:  Right breast 10 o'clock mass.   EXAM:  ULTRASOUND GUIDED RIGHT BREAST CORE NEEDLE BIOPSY   COMPARISON:  Previous  exam(s).   PROCEDURE:  I met with the patient and we discussed the procedure of  ultrasound-guided biopsy, including benefits and alternatives. We  discussed the high likelihood of a successful procedure. We  discussed the risks of the procedure, including infection, bleeding,  tissue injury, clip migration, and inadequate sampling. Informed  written consent was given. The usual time-out protocol was performed  immediately prior to the procedure.   Lesion quadrant: Upper outer quadrant   Using sterile technique and 1% Lidocaine as local anesthetic, under  direct ultrasound visualization, a 14 gauge spring-loaded device was  used to perform biopsy of right breast 10 o'clock mass using a  inferior approach. At the conclusion of the procedure venus shaped  tissue marker clip was deployed into the biopsy cavity. Follow up 2  view mammogram was performed and dictated separately.   IMPRESSION:  Ultrasound guided biopsy of the right breast. No apparent  complications.   Electronically Signed:  By: Fidela Salisbury M.D.  On: 02/08/2021 14:05 Addendum  Addendum by Rolla Etienne, MD on 02/09/2021  4:51 PM EST  ADDENDUM REPORT: 02/09/2021 14:51   ADDENDUM:  PATHOLOGY revealed: A. RIGHT BREAST, 10:00 2 CMFN; ULTRASOUND-GUIDED  BIOPSY: - INTRADUCTAL PAPILLOMA WITH FOCAL ATYPIA. - NEGATIVE FOR  MALIGNANCY. Comment: While there is no evidence of malignancy in  this sample, a more clinically significant lesion cannot be  completely excluded based on a core biopsy. Complete excision of the  lesion is recommended.   Pathology results are CONCORDANT with imaging findings, per Dr.  Fidela Salisbury with excision recommended.   Pathology results and recommendations were discussed with patient  via telephone on 02/09/2021. Patient reported biopsy site doing well  °with no adverse symptoms, and only slight tenderness at the site.  °Post biopsy care instructions were reviewed,  questions were answered  °and my direct phone number was provided. Patient was instructed to  °call [facility] for any additional questions or concerns  °related to biopsy site.  ° °RECOMMENDATION: Surgical consultation. Request for surgical  °consultation relayed to Anne Shaver RN and Sheena Lambert RN at  °[facility] by Linda Nash RN on 02/09/2021.  ° °Pathology results reported by Linda Nash RN on 02/09/2021.  ° ° °Electronically Signed  °  By: Dobrinka  Dimitrova M.D.  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 The risks of general anesthetic, if used, includes MI, CVA, sudden death or even reaction to anesthetic medications also discussed.  °Typical post-op recovery time and possbility of activity restrictions were also discussed.  Alternatives include continued observation.  Benefits include possible symptom relief, pathologic evaluation, and/or curative excision.  ° °The patient verbalized understanding and all questions were answered to the patient's satisfaction. ° °2. Patient has elected to proceed with surgical treatment. Procedure will be scheduled.   ° °labs/images/medications/previous chart entries reviewed personally and relevant changes/updates noted above. ° ° °

## 2021-02-15 NOTE — H&P (Signed)
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 female who was referred by Self for evaluation of above. Change was noted on last screening mammogram. Patient does not routinely do self breast exams.  Patient has not noted a change on breast exam. Age of menarche was 10.s/p hysterectomy. Patient reports hormonal therapy for 10years. Patient is G2P2. Age of first live birth was 36. Patient did breast feed. Patient denies nipple discharge. Patient denies previous breast biopsy. Patient denies a personal history of breast cancer.   Mother with hx of ovarian Cancer.  Pt was tested for BRCA and was negative.  Past Medical History:  has a past medical history of History of depression, endometriosis ([id number]), Hyperlipemia (2017), Hypertension, Migraines, Osteopenia (2015), UI (urinary incontinence), Vitamin B12 deficiency, and Vitamin D deficiency.  Past Surgical History:  has a past surgical history that includes Vaginal hysterectomy (2001); Excision Nasal Polyps; Colonoscopy (2012); and Colonoscopy (05/01/2017).  Family History: family history includes Asthma in her mother; Atrial fibrillation (Abnormal heart rhythm sometimes requiring treatment with blood thinners) in her maternal grandmother; Breast cancer in her maternal aunt and paternal aunt; COPD in her maternal grandmother and maternal uncle; Colon cancer in her paternal grandmother; Congenital heart disease in her maternal grandmother; Diabetes in her maternal grandfather, maternal grandmother, maternal uncle, mother, paternal grandmother, and paternal uncle; Emphysema in her maternal uncle; Heart disease in her mother; Hernia in her mother; High blood pressure (Hypertension) in her maternal grandmother and mother; Hyperlipidemia (Elevated cholesterol) in her mother; Hypothyroidism in her maternal aunt, maternal grandmother, maternal uncle, and mother; Melanoma in her father; Myocardial Infarction (Heart  attack) in her maternal grandmother; Osteoarthritis in her father, maternal aunt, maternal grandfather, maternal grandmother, and maternal uncle; Osteoporosis (Thinning of bones) in her maternal grandmother and mother; Ovarian cancer in her maternal grandmother and mother; Rheum arthritis in her mother; Stroke in her maternal grandfather and maternal grandmother; Urinary Incontinence in her mother.  Social History:  reports that she has never smoked. She has never used smokeless tobacco. She reports that she does not drink alcohol and does not use drugs.  Current Medications: has a current medication list which includes the following prescription(s): acetaminophen, cyanocobalamin, cyclobenzaprine, ergocalciferol (vitamin d2), fluticasone propionate, jardiance, ketoconazole, multivitamin, sertraline, wegovy, and hydrochlorothiazide.  Allergies:  Allergies as of 02/14/2021 - Reviewed 02/14/2021 Allergen Reaction Noted  Egg Other (See Comments) 06/30/2012  Indomethacin Unknown 02/16/2017  Nsaids (non-steroidal anti-inflammatory drug) Other (See Comments) 02/14/2021   ROS:  A 15 point review of systems was performed and was negative except as noted in HPI   Objective:    BP 131/84    Pulse (!) 117    Ht 167.6 cm (5' 6")    Wt (!) 102.1 kg (225 lb)    BMI 36.32 kg/m   Constitutional :  No distress, cooperative, alert Lymphatics/Throat:  Supple with no lymphadenopathy Respiratory:  Clear to auscultation bilaterally Cardiovascular:  Regular rate and rhythm Gastrointestinal: Soft, non-tender, non-distended, no organomegaly. Musculoskeletal: Steady gait and movement Skin: Cool and moist, no surgical scars Psychiatric: Normal affect, non-agitated, not confused Breast: Normal appearance and no palpable abnormality in bilateral breasts and axilla.  Chaperone present for exam.     LABS:  SURGICAL PATHOLOGY  SURGICAL PATHOLOGY  CASE: [id number]  PATIENT: Sheila Rose  Surgical Pathology  Report      Specimen Submitted:  A. Breast, right   Clinical History: Venus clip within site of biopsy in right upper  outer  quadrant.   Hemorrhagic cyst.       DIAGNOSIS:  A. RIGHT BREAST, 10:00 2 CMFN; ULTRASOUND-GUIDED BIOPSY:  - INTRADUCTAL PAPILLOMA WITH FOCAL ATYPIA.  - NEGATIVE FOR MALIGNANCY.   Comment:  While there is no evidence of malignancy in this sample, a more  clinically significant lesion cannot be completely excluded based on a  core biopsy.  Complete excision of the lesion is recommended.    GROSS DESCRIPTION:  A. Labeled: Right breast 10:00 2 cm from nipple  Received: Formalin  Time/date in fixative: Collected and placed in formalin at 1:41 PM on  02/08/2021  Cold ischemic time: Less than 1 minute  Total fixation time: Approximately 6.75 hours  Core pieces: 3 cores and multiple additional fragments  Size: Range from 0.5-0.8 cm in length and 0.2 cm in diameter  Description: Received are cores and fragments of yellow hemorrhagic  fibrofatty tissue.  The additional fragments are 0.9 x 0.2 x 0.1 cm in  aggregate.  Ink color: Blue  Entirely submitted in cassettes 1-2 with 2 cores in cassette 1 and 1  core with the remaining fragments in cassette 2.   RB 02/08/2021   Final Diagnosis performed by Betsy Pries, MD.   Electronically signed  02/09/2021 1:26:46PM  The electronic signature indicates that the named Attending Pathologist  has evaluated the specimen  Technical component performed at [facility], [address], [place],  [place] [address] Lab: [phone] Dir: Rush Farmer, MD, MMM   Professional component performed at [facility], [facility], [place], [place], [place] [address] Lab: [phone]  Dir: Kathi Simpers, MD     RADS: This result has an attachment that is not available.  CLINICAL DATA:  Right breast 10 o'clock mass.   EXAM:  ULTRASOUND GUIDED RIGHT BREAST CORE NEEDLE BIOPSY   COMPARISON:  Previous  exam(s).   PROCEDURE:  I met with the patient and we discussed the procedure of  ultrasound-guided biopsy, including benefits and alternatives. We  discussed the high likelihood of a successful procedure. We  discussed the risks of the procedure, including infection, bleeding,  tissue injury, clip migration, and inadequate sampling. Informed  written consent was given. The usual time-out protocol was performed  immediately prior to the procedure.   Lesion quadrant: Upper outer quadrant   Using sterile technique and 1% Lidocaine as local anesthetic, under  direct ultrasound visualization, a 14 gauge spring-loaded device was  used to perform biopsy of right breast 10 o'clock mass using a  inferior approach. At the conclusion of the procedure venus shaped  tissue marker clip was deployed into the biopsy cavity. Follow up 2  view mammogram was performed and dictated separately.   IMPRESSION:  Ultrasound guided biopsy of the right breast. No apparent  complications.   Electronically Signed:  By: Fidela Salisbury M.D.  On: 02/08/2021 14:05 Addendum  Addendum by Rolla Etienne, MD on 02/09/2021  4:51 PM EST  ADDENDUM REPORT: 02/09/2021 14:51   ADDENDUM:  PATHOLOGY revealed: A. RIGHT BREAST, 10:00 2 CMFN; ULTRASOUND-GUIDED  BIOPSY: - INTRADUCTAL PAPILLOMA WITH FOCAL ATYPIA. - NEGATIVE FOR  MALIGNANCY. Comment: While there is no evidence of malignancy in  this sample, a more clinically significant lesion cannot be  completely excluded based on a core biopsy. Complete excision of the  lesion is recommended.   Pathology results are CONCORDANT with imaging findings, per Dr.  Fidela Salisbury with excision recommended.   Pathology results and recommendations were discussed with patient  via telephone on 02/09/2021. Patient reported biopsy site doing well  with no adverse symptoms, and only slight tenderness at the site.  Post biopsy care instructions were reviewed,  questions were answered  and my direct phone number was provided. Patient was instructed to  call [facility] for any additional questions or concerns  related to biopsy site.   RECOMMENDATION: Surgical consultation. Request for surgical  consultation relayed to Al Pimple RN and Tanya Nones RN at  [facility] by Electa Sniff RN on 02/09/2021.   Pathology results reported by Electa Sniff RN on 02/09/2021.    Electronically Signed    By: Fidela Salisbury M.D.    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 The risks of general anesthetic, if used, includes MI, CVA, sudden death or even reaction to anesthetic medications also discussed.  Typical post-op recovery time and possbility of activity restrictions were also discussed.  Alternatives include continued observation.  Benefits include possible symptom relief, pathologic evaluation, and/or curative excision.   The patient verbalized understanding and all questions were answered to the patient's satisfaction.  2. Patient has elected to proceed with surgical treatment. Procedure will be scheduled.    labs/images/medications/previous chart entries reviewed personally and relevant changes/updates noted above.

## 2021-02-17 ENCOUNTER — Encounter
Admission: RE | Admit: 2021-02-17 | Discharge: 2021-02-17 | Disposition: A | Discharge status: Home / Self Care | Payer: BC Managed Care – PPO | Source: Ambulatory Visit | Attending: Surgery | Admitting: Surgery

## 2021-02-17 ENCOUNTER — Other Ambulatory Visit: Payer: Self-pay

## 2021-02-17 VITALS — Ht 66.0 in | Wt 198.0 lb

## 2021-02-17 DIAGNOSIS — K769 Liver disease, unspecified: Secondary | ICD-10-CM

## 2021-02-17 DIAGNOSIS — E119 Type 2 diabetes mellitus without complications: Secondary | ICD-10-CM

## 2021-02-17 HISTORY — DX: Unspecified osteoarthritis, unspecified site: M19.90

## 2021-02-17 HISTORY — DX: Depression, unspecified: F32.A

## 2021-02-17 HISTORY — DX: Other ill-defined heart diseases: I51.89

## 2021-02-17 HISTORY — DX: Sleep apnea, unspecified: G47.30

## 2021-02-17 HISTORY — DX: Fatty (change of) liver, not elsewhere classified: K76.0

## 2021-02-17 HISTORY — DX: Aneurysm of unspecified site: I72.9

## 2021-02-17 HISTORY — DX: Bronchitis, not specified as acute or chronic: J40

## 2021-02-17 HISTORY — DX: Type 2 diabetes mellitus without complications: E11.9

## 2021-02-17 HISTORY — DX: Headache, unspecified: R51.9

## 2021-02-17 NOTE — Patient Instructions (Signed)
Your procedure is scheduled on:02-23-21 Thursday Report to the Registration Desk on the 1st floor of the [facility].Then proceed to the 2nd floor Surgery Desk in the [facility] To find out your arrival time, please call [phone] between 1PM - 3PM on:02-22-21 Wednesday  REMEMBER: Instructions that are not followed completely may result in serious medical risk, up to and including death; or upon the discretion of your surgeon and anesthesiologist your surgery may need to be rescheduled.  Do not eat food after midnight the night before surgery.  No gum chewing, lozengers or hard candies.  You may however, drink Water up to 2 hours before you are scheduled to arrive for your surgery. Do not drink anything within 2 hours of your scheduled arrival time.  Type 1 and Type 2 diabetics should only drink water.  TAKE THESE MEDICATIONS THE MORNING OF SURGERY WITH A SIP OF WATER: -allopurinol (ZYLOPRIM)   Stop your empagliflozin (JARDIANCE) 3 days prior to surgery-Last dose on 02-19-21 Sunday  Bring your albuterol (PROVENTIL HFA;VENTOLIN HFA) 108 (90 Base) MCG/ACT inhaler to the hospital  One week prior to surgery: Stop Anti-inflammatories (NSAIDS) such as Advil, Aleve, Ibuprofen, Motrin, Naproxen, Naprosyn and Aspirin based products such as Excedrin, Goodys Powder, BC Powder.You may however, continue to take Tylenol if needed for pain up until the day of surgery.  Stop ANY OVER THE COUNTER supplements/vitamins NOW (02-17-21) until after surgery (VITAMIN D, Multiple Vitamin and vitamin B-12 )  No Alcohol for 24 hours before or after surgery.  No Smoking including e-cigarettes for 24 hours prior to surgery.  No chewable tobacco products for at least 6 hours prior to surgery.  No nicotine patches on the day of surgery.  Do not use any "recreational" drugs for at least a week prior to your surgery.  Please be advised that the combination of cocaine and anesthesia may have negative outcomes, up  to and including death. If you test positive for cocaine, your surgery will be cancelled.  On the morning of surgery brush your teeth with toothpaste and water, you may rinse your mouth with mouthwash if you wish. Do not swallow any toothpaste or mouthwash.  Use CHG Soap as directed on instruction sheet.  Do not wear jewelry, make-up, hairpins, clips or nail polish.  Do not wear lotions, powders, or perfumes.   Do not shave body from the neck down 48 hours prior to surgery just in case you cut yourself which could leave a site for infection.  Also, freshly shaved skin may become irritated if using the CHG soap.  Contact lenses, hearing aids and dentures may not be worn into surgery.  Do not bring valuables to the hospital. [facility] is not responsible for any missing/lost belongings or valuables.   Bring your C-PAP to the hospital with you  Notify your doctor if there is any change in your medical condition (cold, fever, infection).  Wear comfortable clothing (specific to your surgery type) to the hospital.  After surgery, you can help prevent lung complications by doing breathing exercises.  Take deep breaths and cough every 1-2 hours. Your doctor may order a device called an Incentive Spirometer to help you take deep breaths. When coughing or sneezing, hold a pillow firmly against your incision with both hands. This is called splinting. Doing this helps protect your incision. It also decreases belly discomfort.  If you are being admitted to the hospital overnight, leave your suitcase in the car. After surgery it may be brought  to your room.  If you are being discharged the day of surgery, you will not be allowed to drive home. You will need a responsible adult (18 years or older) to drive you home and stay with you that night.   If you are taking public transportation, you will need to have a responsible adult (18 years or older) with you. Please confirm with your physician  that it is acceptable to use public transportation.   Please call the [facility] at [phone] if you have any questions about these instructions.  Surgery Visitation Policy:  Patients undergoing a surgery or procedure may have one family member or support person with them as long as that person is not COVID-19 positive or experiencing its symptoms.  That person may remain in the waiting area during the procedure and may rotate out with other people.  Inpatient Visitation:    Visiting hours are 7 a.m. to 8 p.m. Up to two visitors ages 16+ are allowed at one time in a patient room. The visitors may rotate out with other people during the day. Visitors must check out when they leave, or other visitors will not be allowed. One designated support person may remain overnight. The visitor must pass COVID-19 screenings, use hand sanitizer when entering and exiting the patients room and wear a mask at all times, including in the patients room. Patients must also wear a mask when staff or their visitor are in the room. Masking is required regardless of vaccination status.

## 2021-02-21 ENCOUNTER — Other Ambulatory Visit: Payer: Self-pay

## 2021-02-21 ENCOUNTER — Encounter
Admission: RE | Admit: 2021-02-21 | Discharge: 2021-02-21 | Disposition: A | Discharge status: Home / Self Care | Payer: BC Managed Care – PPO | Source: Ambulatory Visit | Attending: Surgery | Admitting: Surgery

## 2021-02-21 ENCOUNTER — Ambulatory Visit
Admission: RE | Admit: 2021-02-21 | Discharge: 2021-02-21 | Disposition: A | Discharge status: Home / Self Care | Payer: BC Managed Care – PPO | Source: Ambulatory Visit | Attending: Surgery | Admitting: Surgery

## 2021-02-21 DIAGNOSIS — D241 Benign neoplasm of right breast: Secondary | ICD-10-CM | POA: Diagnosis not present

## 2021-02-21 DIAGNOSIS — N6099 Unspecified benign mammary dysplasia of unspecified breast: Secondary | ICD-10-CM | POA: Diagnosis not present

## 2021-02-21 DIAGNOSIS — Z85828 Personal history of other malignant neoplasm of skin: Secondary | ICD-10-CM | POA: Diagnosis not present

## 2021-02-21 DIAGNOSIS — Z01818 Encounter for other preprocedural examination: Secondary | ICD-10-CM | POA: Insufficient documentation

## 2021-02-21 DIAGNOSIS — F32A Depression, unspecified: Secondary | ICD-10-CM | POA: Diagnosis not present

## 2021-02-21 DIAGNOSIS — I10 Essential (primary) hypertension: Secondary | ICD-10-CM | POA: Insufficient documentation

## 2021-02-21 DIAGNOSIS — K769 Liver disease, unspecified: Secondary | ICD-10-CM | POA: Insufficient documentation

## 2021-02-21 DIAGNOSIS — N6091 Unspecified benign mammary dysplasia of right breast: Secondary | ICD-10-CM | POA: Diagnosis present

## 2021-02-21 DIAGNOSIS — N6489 Other specified disorders of breast: Secondary | ICD-10-CM | POA: Diagnosis not present

## 2021-02-21 DIAGNOSIS — E119 Type 2 diabetes mellitus without complications: Secondary | ICD-10-CM | POA: Diagnosis not present

## 2021-02-21 DIAGNOSIS — Z8041 Family history of malignant neoplasm of ovary: Secondary | ICD-10-CM | POA: Diagnosis not present

## 2021-02-21 DIAGNOSIS — Z7984 Long term (current) use of oral hypoglycemic drugs: Secondary | ICD-10-CM | POA: Diagnosis not present

## 2021-02-21 DIAGNOSIS — Z90710 Acquired absence of both cervix and uterus: Secondary | ICD-10-CM | POA: Diagnosis not present

## 2021-02-21 DIAGNOSIS — G4730 Sleep apnea, unspecified: Secondary | ICD-10-CM | POA: Diagnosis not present

## 2021-02-21 LAB — COMPREHENSIVE METABOLIC PANEL
ALT: 26 U/L (ref 0–44)
AST: 22 U/L (ref 15–41)
Albumin: 4.3 g/dL (ref 3.5–5.0)
Alkaline Phosphatase: 108 U/L (ref 38–126)
Anion gap: 10 (ref 5–15)
BUN: 19 mg/dL (ref 8–23)
CO2: 26 mmol/L (ref 22–32)
Calcium: 9.4 mg/dL (ref 8.9–10.3)
Chloride: 103 mmol/L (ref 98–111)
Creatinine, Ser: 0.77 mg/dL (ref 0.44–1.00)
GFR, Estimated: >60 mL/min (ref >60)
Glucose, Bld: 165 mg/dL — ABNORMAL HIGH (ref 70–99)
Potassium: 3.6 mmol/L (ref 3.5–5.1)
Sodium: 139 mmol/L (ref 135–145)
Total Bilirubin: 0.8 mg/dL (ref 0.3–1.2)
Total Protein: 7.3 g/dL (ref 6.5–8.1)

## 2021-02-22 NOTE — Anesthesia Preprocedure Evaluation (Addendum)
Anesthesia Evaluation  Patient identified by MRN, date of birth, ID band Patient awake    Reviewed: Allergy & Precautions, NPO status , Patient's Chart, lab work & pertinent test results  Airway Mallampati: III  TM Distance: >3 FB Neck ROM: full    Dental no notable dental hx.    Pulmonary sleep apnea and Continuous Positive Airway Pressure Ventilation ,    Pulmonary exam normal        Cardiovascular Exercise Tolerance: Good METS (>4): hypertension, Normal cardiovascular exam  DD   Neuro/Psych negative neurological ROS  negative psych ROS   GI/Hepatic negative GI ROS, Non-alcoholic fatty liver disease   Endo/Other  negative endocrine ROSdiabetes, Type 2, Oral Hypoglycemic Agents  Renal/GU      Musculoskeletal  (+) Arthritis , Osteoarthritis,    Abdominal (+) + obese,   Peds  Hematology negative hematology ROS (+)   Anesthesia Other Findings Past Medical History: No date: Aneurysm (HCC)     Comment:  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 No date: Arthritis     Comment:  psoriatic No date: Bronchitis No date: Cancer ([place])     Comment:  skin No date: Depression No date: Diabetes mellitus without complication (HCC) No date: Diastolic dysfunction No date: Headache     Comment:  daily No date: Hypertension No date: Non-alcoholic fatty liver disease 2019: Pneumonia No date: Sleep apnea     Comment:  uses cpap  Past Surgical History: No date: ABDOMINAL HYSTERECTOMY 02/08/2021: BREAST BIOPSY; Right     Comment:  [place] Bx, Venus Clip, path pending No date: DIAGNOSTIC LAPAROSCOPY     Comment:  endometriosis No date: NASAL SINUS SURGERY     Reproductive/Obstetrics negative OB ROS                            Anesthesia Physical Anesthesia Plan  ASA: 2  Anesthesia Plan: General   Post-op Pain Management:    Induction: Intravenous  PONV Risk Score and Plan:  Ondansetron, Dexamethasone, Midazolam and Treatment may vary due to age or medical condition  Airway Management Planned: LMA  Additional Equipment:   Intra-op Plan:   Post-operative Plan: Extubation in OR  Informed Consent: I have reviewed the patients History and Physical, chart, labs and discussed the procedure including the risks, benefits and alternatives for the proposed anesthesia with the patient or authorized representative who has indicated his/her understanding and acceptance.     Dental Advisory Given and Dental advisory given  Plan Discussed with: Anesthesiologist, CRNA and Surgeon  Anesthesia Plan Comments:        Anesthesia Quick Evaluation

## 2021-02-23 ENCOUNTER — Ambulatory Visit
Admission: RE | Admit: 2021-02-23 | Discharge: 2021-02-23 | Disposition: A | Discharge status: Home / Self Care | Payer: BC Managed Care – PPO | Attending: Surgery | Admitting: Surgery

## 2021-02-23 ENCOUNTER — Other Ambulatory Visit: Payer: Self-pay

## 2021-02-23 ENCOUNTER — Ambulatory Visit: Payer: BC Managed Care – PPO | Admitting: Urgent Care

## 2021-02-23 ENCOUNTER — Encounter
Admission: RE | Disposition: A | Discharge status: Home / Self Care | Payer: Self-pay | Source: Home / Self Care | Attending: Surgery

## 2021-02-23 ENCOUNTER — Encounter: Payer: Self-pay | Admitting: Surgery

## 2021-02-23 ENCOUNTER — Ambulatory Visit
Admission: RE | Admit: 2021-02-23 | Discharge: 2021-02-23 | Disposition: A | Discharge status: Home / Self Care | Payer: BC Managed Care – PPO | Source: Ambulatory Visit | Attending: Surgery | Admitting: Surgery

## 2021-02-23 DIAGNOSIS — G4730 Sleep apnea, unspecified: Secondary | ICD-10-CM | POA: Insufficient documentation

## 2021-02-23 DIAGNOSIS — D241 Benign neoplasm of right breast: Secondary | ICD-10-CM | POA: Insufficient documentation

## 2021-02-23 DIAGNOSIS — I10 Essential (primary) hypertension: Secondary | ICD-10-CM | POA: Insufficient documentation

## 2021-02-23 DIAGNOSIS — Z90710 Acquired absence of both cervix and uterus: Secondary | ICD-10-CM | POA: Insufficient documentation

## 2021-02-23 DIAGNOSIS — E119 Type 2 diabetes mellitus without complications: Secondary | ICD-10-CM | POA: Insufficient documentation

## 2021-02-23 DIAGNOSIS — N6489 Other specified disorders of breast: Secondary | ICD-10-CM | POA: Insufficient documentation

## 2021-02-23 DIAGNOSIS — F32A Depression, unspecified: Secondary | ICD-10-CM | POA: Insufficient documentation

## 2021-02-23 DIAGNOSIS — N6099 Unspecified benign mammary dysplasia of unspecified breast: Secondary | ICD-10-CM

## 2021-02-23 DIAGNOSIS — Z8041 Family history of malignant neoplasm of ovary: Secondary | ICD-10-CM | POA: Insufficient documentation

## 2021-02-23 DIAGNOSIS — N6091 Unspecified benign mammary dysplasia of right breast: Secondary | ICD-10-CM

## 2021-02-23 DIAGNOSIS — Z7984 Long term (current) use of oral hypoglycemic drugs: Secondary | ICD-10-CM | POA: Insufficient documentation

## 2021-02-23 DIAGNOSIS — Z85828 Personal history of other malignant neoplasm of skin: Secondary | ICD-10-CM | POA: Insufficient documentation

## 2021-02-23 HISTORY — PX: BREAST LUMPECTOMY WITH RADIO FREQUENCY LOCALIZER: SHX6897

## 2021-02-23 HISTORY — PX: BREAST EXCISIONAL BIOPSY: SUR124

## 2021-02-23 LAB — GLUCOSE, CAPILLARY
Glucose-Capillary: 141 mg/dL — ABNORMAL HIGH (ref 70–99)
Glucose-Capillary: 123 mg/dL — ABNORMAL HIGH (ref 70–99)

## 2021-02-23 SURGERY — BREAST LUMPECTOMY WITH RADIO FREQUENCY LOCALIZER
Anesthesia: General | Site: Breast | Laterality: Right

## 2021-02-23 MED ORDER — EPHEDRINE SULFATE (PRESSORS) 50 MG/ML IJ SOLN
INTRAMUSCULAR | Status: DC | PRN
  Administered 2021-02-23: 15 mg via INTRAVENOUS
  Administered 2021-02-23: 10 mg via INTRAVENOUS

## 2021-02-23 MED ORDER — OXYCODONE HCL 5 MG PO TABS: 5.0000 mg | ORAL_TABLET | Freq: Four times a day (QID) | ORAL | 0 refills | Status: AC | PRN

## 2021-02-23 MED ORDER — CHLORHEXIDINE GLUCONATE CLOTH 2 % EX PADS: 6.0000 | MEDICATED_PAD | Freq: Once | CUTANEOUS | Status: DC

## 2021-02-23 MED ORDER — ACETAMINOPHEN 10 MG/ML IV SOLN: 1000.0000 mg | Freq: Once | INTRAVENOUS | Status: DC | PRN

## 2021-02-23 MED ORDER — STERILE WATER FOR IRRIGATION IR SOLN
Status: DC | PRN
  Administered 2021-02-23: 1000 mL

## 2021-02-23 MED ORDER — LIDOCAINE HCL (PF) 1 % IJ SOLN
INTRAMUSCULAR | Status: AC
  Filled 2021-02-23: qty 30

## 2021-02-23 MED ORDER — FENTANYL CITRATE (PF) 100 MCG/2ML IJ SOLN
INTRAMUSCULAR | Status: DC | PRN
  Administered 2021-02-23: 100 ug via INTRAVENOUS

## 2021-02-23 MED ORDER — ONDANSETRON HCL 4 MG/2ML IJ SOLN
INTRAMUSCULAR | Status: DC | PRN
  Administered 2021-02-23: 4 mg via INTRAVENOUS

## 2021-02-23 MED ORDER — BUPIVACAINE-EPINEPHRINE (PF) 0.5% -1:200000 IJ SOLN
INTRAMUSCULAR | Status: AC
  Filled 2021-02-23: qty 30

## 2021-02-23 MED ORDER — CHLORHEXIDINE GLUCONATE 0.12 % MT SOLN
OROMUCOSAL | Status: AC
  Filled 2021-02-23: qty 15

## 2021-02-23 MED ORDER — FENTANYL CITRATE (PF) 100 MCG/2ML IJ SOLN
INTRAMUSCULAR | Status: AC
  Filled 2021-02-23: qty 2

## 2021-02-23 MED ORDER — OXYCODONE HCL 5 MG PO TABS: 5.0000 mg | ORAL_TABLET | Freq: Once | ORAL | Status: DC | PRN

## 2021-02-23 MED ORDER — OXYCODONE HCL 5 MG/5ML PO SOLN: 5.0000 mg | Freq: Once | ORAL | Status: DC | PRN

## 2021-02-23 MED ORDER — PROPOFOL 500 MG/50ML IV EMUL
INTRAVENOUS | Status: AC
  Filled 2021-02-23: qty 50

## 2021-02-23 MED ORDER — DOCUSATE SODIUM 100 MG PO CAPS: 100.0000 mg | ORAL_CAPSULE | Freq: Two times a day (BID) | ORAL | 0 refills | Status: AC | PRN

## 2021-02-23 MED ORDER — ORAL CARE MOUTH RINSE: 15.0000 mL | Freq: Once | OROMUCOSAL | Status: AC

## 2021-02-23 MED ORDER — PHENYLEPHRINE HCL (PRESSORS) 10 MG/ML IV SOLN
INTRAVENOUS | Status: DC | PRN
  Administered 2021-02-23 (×2): 200 ug via INTRAVENOUS

## 2021-02-23 MED ORDER — LIDOCAINE HCL 1 % IJ SOLN
INTRAMUSCULAR | Status: DC | PRN
  Administered 2021-02-23: 8 mL via INTRAMUSCULAR

## 2021-02-23 MED ORDER — CHLORHEXIDINE GLUCONATE 0.12 % MT SOLN
15.0000 mL | Freq: Once | OROMUCOSAL | Status: AC
  Administered 2021-02-23: 15 mL via OROMUCOSAL

## 2021-02-23 MED ORDER — PROPOFOL 10 MG/ML IV BOLUS
INTRAVENOUS | Status: DC | PRN
Start: 2021-02-23 — End: 2021-02-23
  Administered 2021-02-23: 200 mg via INTRAVENOUS

## 2021-02-23 MED ORDER — PROMETHAZINE HCL 25 MG/ML IJ SOLN: 6.2500 mg | INTRAMUSCULAR | Status: DC | PRN

## 2021-02-23 MED ORDER — LIDOCAINE HCL (CARDIAC) PF 100 MG/5ML IV SOSY
PREFILLED_SYRINGE | INTRAVENOUS | Status: DC | PRN
Start: 2021-02-23 — End: 2021-02-23
  Administered 2021-02-23: 100 mg via INTRAVENOUS

## 2021-02-23 MED ORDER — PHENYLEPHRINE HCL-NACL 20-0.9 MG/250ML-% IV SOLN
INTRAVENOUS | Status: DC | PRN
  Administered 2021-02-23: 50 ug/min via INTRAVENOUS

## 2021-02-23 MED ORDER — LACTATED RINGERS IV SOLN: INTRAVENOUS | Status: DC | PRN

## 2021-02-23 MED ORDER — CEFAZOLIN SODIUM-DEXTROSE 2-4 GM/100ML-% IV SOLN
INTRAVENOUS | Status: AC
  Filled 2021-02-23: qty 100

## 2021-02-23 MED ORDER — SODIUM CHLORIDE 0.9 % IV SOLN: INTRAVENOUS | Status: DC

## 2021-02-23 MED ORDER — CEFAZOLIN SODIUM-DEXTROSE 2-4 GM/100ML-% IV SOLN
2.0000 g | INTRAVENOUS | Status: AC
  Administered 2021-02-23: 2 g via INTRAVENOUS

## 2021-02-23 MED ORDER — FENTANYL CITRATE (PF) 100 MCG/2ML IJ SOLN: 25.0000 ug | INTRAMUSCULAR | Status: DC | PRN

## 2021-02-23 MED ORDER — ACETAMINOPHEN 325 MG PO TABS: 650.0000 mg | ORAL_TABLET | Freq: Three times a day (TID) | ORAL | 0 refills | Status: AC | PRN

## 2021-02-23 SURGICAL SUPPLY — 47 items
APPLIER CLIP 11 MED OPEN (CLIP)
BLADE SURG 15 STRL LF DISP TIS (BLADE) ×1 IMPLANT
BLADE SURG 15 STRL SS (BLADE) ×1
CHLORAPREP W/TINT 26 (MISCELLANEOUS) ×2 IMPLANT
CLIP APPLIE 11 MED OPEN (CLIP) IMPLANT
CNTNR SPEC 2.5X3XGRAD LEK (MISCELLANEOUS) ×1
CONT SPEC 4OZ STER OR WHT (MISCELLANEOUS) ×1
CONTAINER SPEC 2.5X3XGRAD LEK (MISCELLANEOUS) ×1 IMPLANT
DERMABOND ADVANCED (GAUZE/BANDAGES/DRESSINGS) ×1
DERMABOND ADVANCED .7 DNX12 (GAUZE/BANDAGES/DRESSINGS) ×1 IMPLANT
DEVICE DSSCT PLSMBLD 3.0S LGHT (MISCELLANEOUS) ×1 IMPLANT
DEVICE DUBIN SPECIMEN MAMMOGRA (MISCELLANEOUS) ×2 IMPLANT
DRAPE LAPAROTOMY TRNSV 106X77 (MISCELLANEOUS) ×2 IMPLANT
ELECT CAUTERY BLADE TIP 2.5 (TIP) ×2
ELECT REM PT RETURN 9FT ADLT (ELECTROSURGICAL) ×2
ELECTRODE CAUTERY BLDE TIP 2.5 (TIP) ×1 IMPLANT
ELECTRODE REM PT RTRN 9FT ADLT (ELECTROSURGICAL) ×1 IMPLANT
GAUZE 4X4 16PLY ~~LOC~~+RFID DBL (SPONGE) ×2 IMPLANT
GLOVE SURG SYN 6.5 ES PF (GLOVE) ×2 IMPLANT
GLOVE SURG SYN 6.5 PF PI (GLOVE) ×1 IMPLANT
GLOVE SURG UNDER POLY LF SZ7 (GLOVE) ×2 IMPLANT
GOWN STRL REUS W/ TWL LRG LVL3 (GOWN DISPOSABLE) ×3 IMPLANT
GOWN STRL REUS W/TWL LRG LVL3 (GOWN DISPOSABLE) ×3
JACKSON PRATT 10 (INSTRUMENTS) IMPLANT
KIT MARKER MARGIN INK (KITS) ×1 IMPLANT
KIT TURNOVER KIT A (KITS) ×2 IMPLANT
LABEL OR SOLS (LABEL) ×2 IMPLANT
LIGHT WAVEGUIDE WIDE FLAT (MISCELLANEOUS) IMPLANT
MANIFOLD NEPTUNE II (INSTRUMENTS) ×2 IMPLANT
MARKER MARGIN CORRECT CLIP (MARKER) ×2 IMPLANT
NEEDLE HYPO 22GX1.5 SAFETY (NEEDLE) ×3 IMPLANT
PACK BASIN MINOR ARMC (MISCELLANEOUS) ×2 IMPLANT
PLASMABLADE 3.0S W/LIGHT (MISCELLANEOUS) ×2
SET LOCALIZER 20 PROBE US (MISCELLANEOUS) ×2 IMPLANT
SUT MNCRL 4-0 (SUTURE) ×2
SUT MNCRL 4-0 27XMFL (SUTURE) ×2
SUT SILK 2 0 (SUTURE) ×1
SUT SILK 2-0 30XBRD TIE 12 (SUTURE) IMPLANT
SUT SILK 3 0 12 30 (SUTURE) IMPLANT
SUT VIC AB 3-0 SH 27 (SUTURE) ×2
SUT VIC AB 3-0 SH 27X BRD (SUTURE) ×2 IMPLANT
SUTURE MNCRL 4-0 27XMF (SUTURE) ×2 IMPLANT
SYR 20ML LL LF (SYRINGE) ×2 IMPLANT
TRAP NEPTUNE SPECIMEN COLLECT (MISCELLANEOUS) ×2 IMPLANT
TUBING CONNECTING 10 (TUBING) ×2 IMPLANT
WATER STERILE IRR 1000ML POUR (IV SOLUTION) ×2 IMPLANT
WATER STERILE IRR 500ML POUR (IV SOLUTION) ×1 IMPLANT

## 2021-02-23 NOTE — Interval H&P Note (Signed)
No change. OK to proceed.

## 2021-02-23 NOTE — Op Note (Signed)
Preoperative diagnosis:  right breast atypical ductal hyperplasia  Postoperative diagnosis: same.   Procedure: RF tag-localized right breast lumpectomy  Anesthesia: GETA  Surgeon: Dr. Benjamine Sprague  Wound Classification: Clean  Indications: Patient is a 61 y.o. female with a nonpalpable right breast mass noted on mammography with core biopsy demonstrating atypical ductal hyperplasia. requires RF localizer placement, partial mastectomy for treatment   Specimen: right Breast mass,   Complications: None  Estimated Blood Loss: 57mL  Findings: 1. Specimen mammography shows marker and RF localizer on specimen 2. Pathology call refers gross examination of margins was negative 3. No other palpable mass  identified.   Description of procedure: RF localization was performed by radiology prior to procedure. Localization studies were reviewed. The patient was taken to the operating room and placed supine on the operating table, and after general anesthesia the right breast was prepped and draped in the usual sterile fashion. A time-out was completed verifying correct patient, procedure, site, positioning, and implant(s) and/or special equipment prior to beginning this procedure.  By identifying the RF localizer, the probable trajectory and location of the mass was visualized. A skin incision was planned in such a way as to minimize the amount of dissection to reach the mass.  The skin incision was made after infusion of local. Flaps were raised and  Sharp and blunt dissection was then taken down to the mass, taking care to include the entire RF localizer and a margin of grossly normal tissue. The specimen was removed. The specimen was oriented with paint and sent to radiology with the localization studies. Confirmation was received that the entire target lesion had been resected. Wound irrigated, hemostasis was achieved and the wound closed in layers with  interrupted sutures of 3-0 Vicryl in deep  dermal layer and a running subcuticular suture of Monocryl 4-0, then dressed with dermabond. The patient tolerated the procedure well and was taken to the postanesthesia care unit in stable condition. Sponge and instrument count correct at end of procedure.

## 2021-02-23 NOTE — Discharge Instructions (Addendum)
Removal, Care After This sheet gives you information about how to care for yourself after your procedure. Your health care provider may also give you more specific instructions. If you have problems or questions, contact your health care provider. What can I expect after the procedure? After the procedure, it is common to have: Soreness. Bruising. Itching. Follow these instructions at home: site care Follow instructions from your health care provider about how to take care of your site. Make sure you: Wash your hands with soap and water before and after you change your bandage (dressing). If soap and water are not available, use hand sanitizer. Leave stitches (sutures), skin glue, or adhesive strips in place. These skin closures may need to stay in place for 2 weeks or longer. If adhesive strip edges start to loosen and curl up, you may trim the loose edges. Do not remove adhesive strips completely unless your health care provider tells you to do that. If the area bleeds or bruises, apply gentle pressure for 10 minutes. OK TO SHOWER IN 24HRS  Check your site every day for signs of infection. Check for: Redness, swelling, or pain. Fluid or blood. Warmth. Pus or a bad smell.  General instructions Rest and then return to your normal activities as told by your health care provider.  tylenol as needed for discomfort.   Use narcotics, if prescribed, only when tylenol and motrin is not enough to control pain.  325-650mg  every 8hrs to max of 3000mg /24hrs (including the 325mg  in every norco dose) for the tylenol.    Advil up to 800mg  per dose every 8hrs as needed for pain.   Keep all follow-up visits as told by your health care provider. This is important. Contact a health care provider if: You have redness, swelling, or pain around your site. You have fluid or blood coming from your site. Your site feels warm to the touch. You have pus or a bad smell coming from your site. You have a  fever. Your sutures, skin glue, or adhesive strips loosen or come off sooner than expected. Get help right away if: You have bleeding that does not stop with pressure or a dressing. Summary After the procedure, it is common to have some soreness, bruising, and itching at the site. Follow instructions from your health care provider about how to take care of your site. Check your site every day for signs of infection. Contact a health care provider if you have redness, swelling, or pain around your site, or your site feels warm to the touch. Keep all follow-up visits as told by your health care provider. This is important. This information is not intended to replace advice given to you by your health care provider. Make sure you discuss any questions you have with your health care provider. Document Released: 01/14/2015 Document Revised: 06/17/2017 Document Reviewed: 06/17/2017 Elsevier Interactive Patient Education  2019 [place]   The drugs that you were given will stay in your system until tomorrow so for the next 24 hours you should not:  Drive an automobile Make any legal decisions Drink any alcoholic beverage   You may resume regular meals tomorrow.  Today it is better to start with liquids and gradually work up to solid foods.  You may eat anything you prefer, but it is better to start with liquids, then soup and crackers, and gradually work up to solid foods.   Please notify your doctor immediately if you have any  unusual bleeding, trouble breathing, redness and pain at the surgery site, drainage, fever, or pain not relieved by medication.    Additional Instructions:    Please contact your physician with any problems or Same Day Surgery at [phone], Monday through Friday 6 am to 4 pm, or [place] at [facility] number at [phone].

## 2021-02-23 NOTE — Transfer of Care (Signed)
Immediate Anesthesia Transfer of Care Note  Patient: ALIMA NASER  Procedure(s) Performed: BREAST LUMPECTOMY WITH RADIO FREQUENCY LOCALIZER (Right: Breast)  Patient Location: PACU  Anesthesia Type:General  Level of Consciousness: sedated  Airway & Oxygen Therapy: Patient Spontanous Breathing and Patient connected to face mask oxygen  Post-op Assessment: Report given to RN and Post -op Vital signs reviewed and stable  Post vital signs: Reviewed and stable  Last Vitals:  Vitals Value Taken Time  BP 110/73 02/23/21 0900  Temp 36.4 C 02/23/21 0900  Pulse 102 02/23/21 0913  Resp 20 02/23/21 0913  SpO2 94 % 02/23/21 0913  Vitals shown include unvalidated device data.  Last Pain:  Vitals:   02/23/21 0715  TempSrc: Temporal  PainSc: 0-No pain         Complications: No notable events documented.

## 2021-02-23 NOTE — Anesthesia Procedure Notes (Signed)
Procedure Name: LMA Insertion Date/Time: 02/23/2021 8:03 AM Performed by: Beverely Low, CRNA Pre-anesthesia Checklist: Patient identified, Patient being monitored, Timeout performed, Emergency Drugs available and Suction available Patient Re-evaluated:Patient Re-evaluated prior to induction Oxygen Delivery Method: Circle system utilized Preoxygenation: Pre-oxygenation with 100% oxygen Induction Type: IV induction Ventilation: Mask ventilation without difficulty LMA: LMA inserted LMA Size: 3.5 Tube type: Oral Number of attempts: 1 Placement Confirmation: positive ETCO2 and breath sounds checked- equal and bilateral Tube secured with: Tape Dental Injury: Teeth and Oropharynx as per pre-operative assessment

## 2021-02-23 NOTE — Anesthesia Postprocedure Evaluation (Signed)
Anesthesia Post Note  Patient: Sheila Rose  Procedure(s) Performed: BREAST LUMPECTOMY WITH RADIO FREQUENCY LOCALIZER (Right: Breast)  Patient location during evaluation: PACU Anesthesia Type: General Level of consciousness: awake and alert Pain management: pain level controlled Vital Signs Assessment: post-procedure vital signs reviewed and stable Respiratory status: spontaneous breathing, nonlabored ventilation and respiratory function stable Cardiovascular status: blood pressure returned to baseline and stable Postop Assessment: no apparent nausea or vomiting Anesthetic complications: no   No notable events documented.   Last Vitals:  Vitals:   02/23/21 0930 02/23/21 0938  BP: 111/61 113/61  Pulse: (!) 103   Resp: 13 16  Temp: 36.4 C 36.4 C  SpO2: 92% 96%    Last Pain:  Vitals:   02/23/21 0938  TempSrc: Temporal  PainSc: 0-No pain                 [place] Ouch

## 2021-02-24 ENCOUNTER — Encounter: Payer: Self-pay | Admitting: Surgery

## 2021-02-24 LAB — SURGICAL PATHOLOGY

## 2021-12-07 ENCOUNTER — Other Ambulatory Visit: Payer: Self-pay | Admitting: Surgery

## 2021-12-07 DIAGNOSIS — Z1231 Encounter for screening mammogram for malignant neoplasm of breast: Secondary | ICD-10-CM

## 2022-01-16 ENCOUNTER — Ambulatory Visit
Admission: RE | Admit: 2022-01-16 | Discharge: 2022-01-16 | Disposition: A | Discharge status: Home / Self Care | Payer: BC Managed Care – PPO | Source: Ambulatory Visit | Attending: Surgery | Admitting: Surgery

## 2022-01-16 DIAGNOSIS — Z1231 Encounter for screening mammogram for malignant neoplasm of breast: Secondary | ICD-10-CM | POA: Diagnosis not present

## 2022-11-12 IMAGING — MG MM PLC BREAST LOC DEV 1ST LESION INC*R*
8 series · 8 of 8 positions shown · non-contrast
Comparison: Previous exam(s)

CLINICAL DATA: Right breast papilloma for radiofrequency device
placement prior to surgical excision.

EXAM:
MAMMOGRAPHIC GUIDED RADIOFREQUENCY DEVICE
LOCALIZATION OF THE RIGHT BREAST

[R LM (1 of 3)]
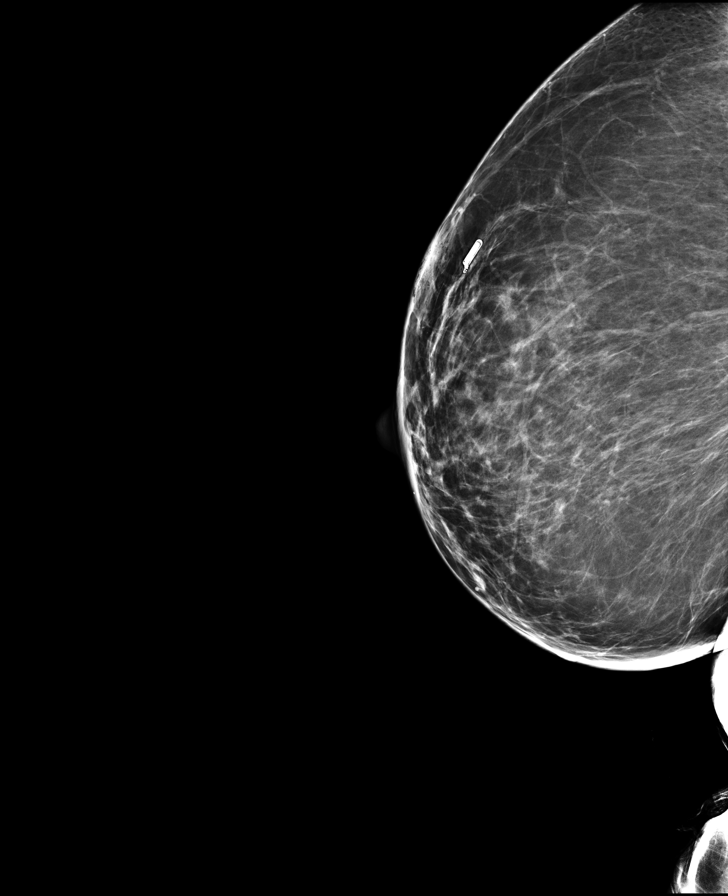

[R CC (1 of 5)]
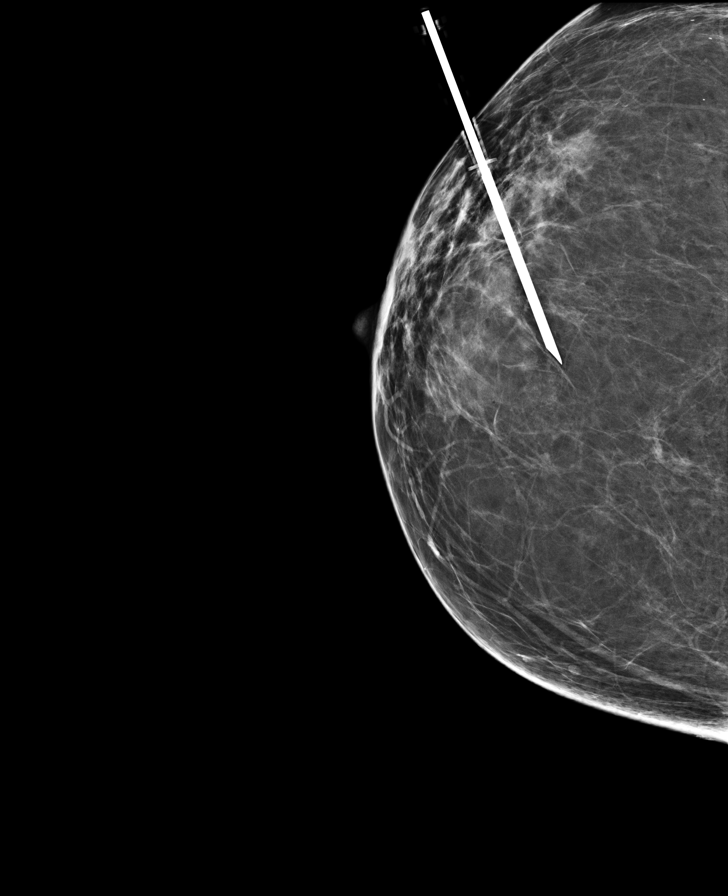

[R LM (2 of 3)]
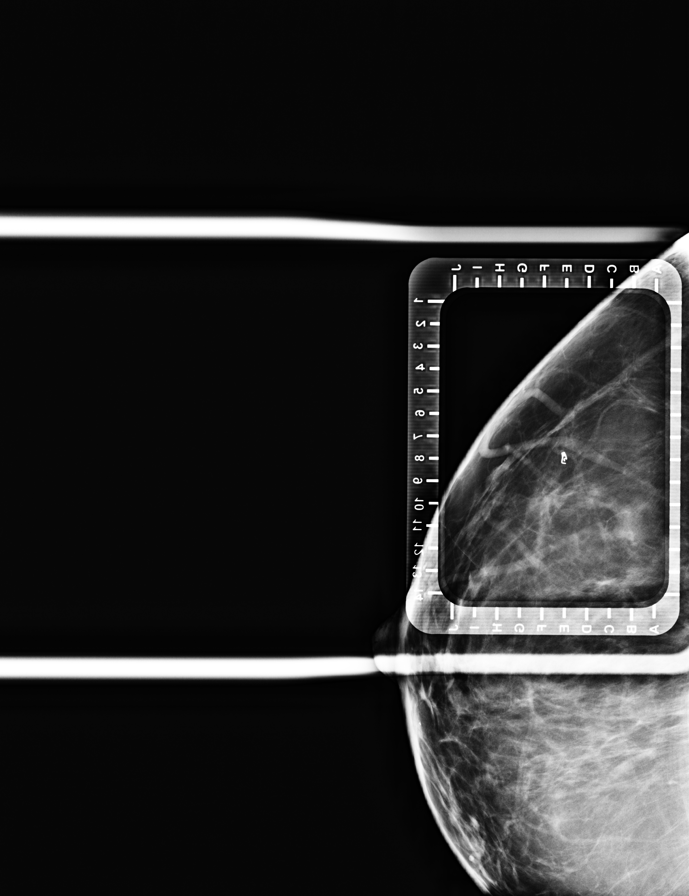

[R CC (2 of 5)]
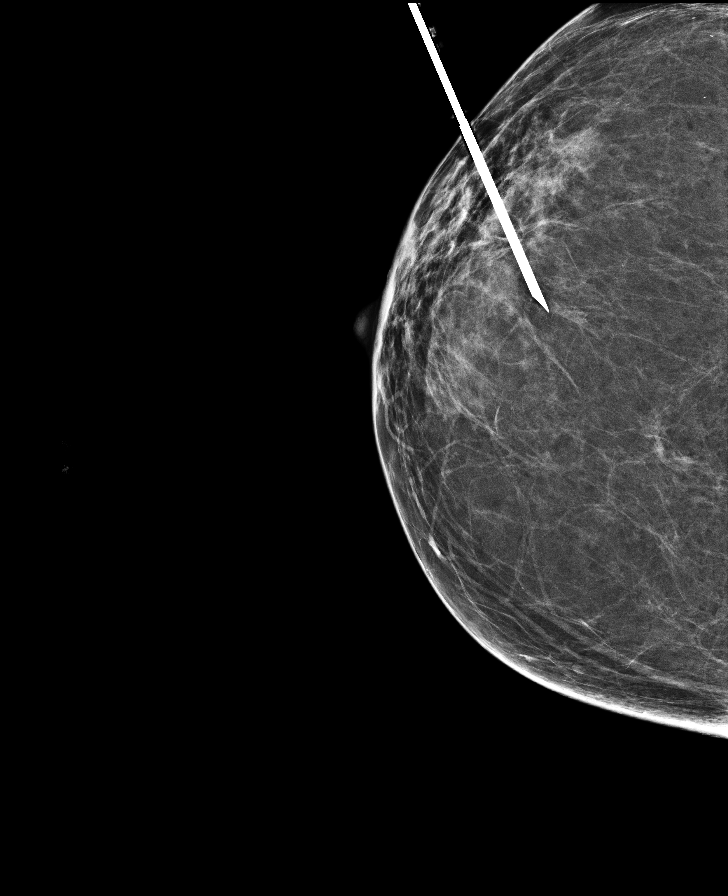

[R CC (3 of 5)]
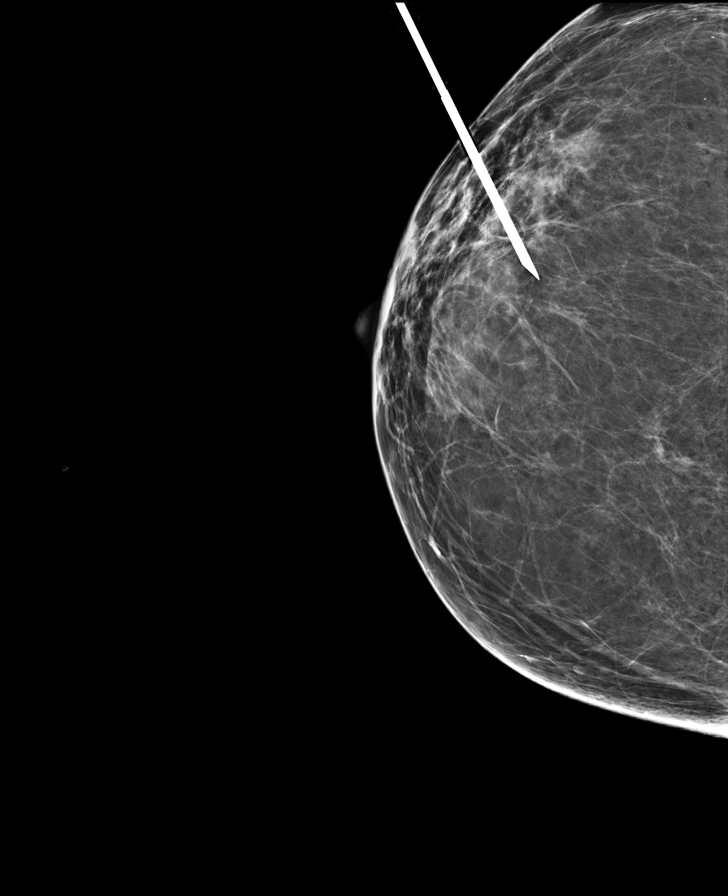

[R CC (4 of 5)]
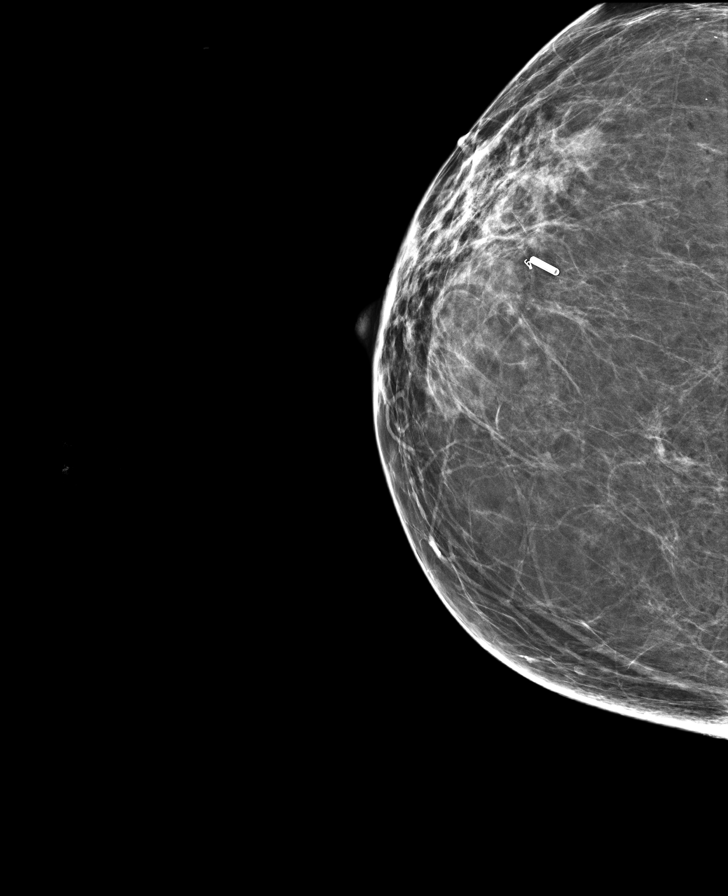

[R LM (3 of 3)]
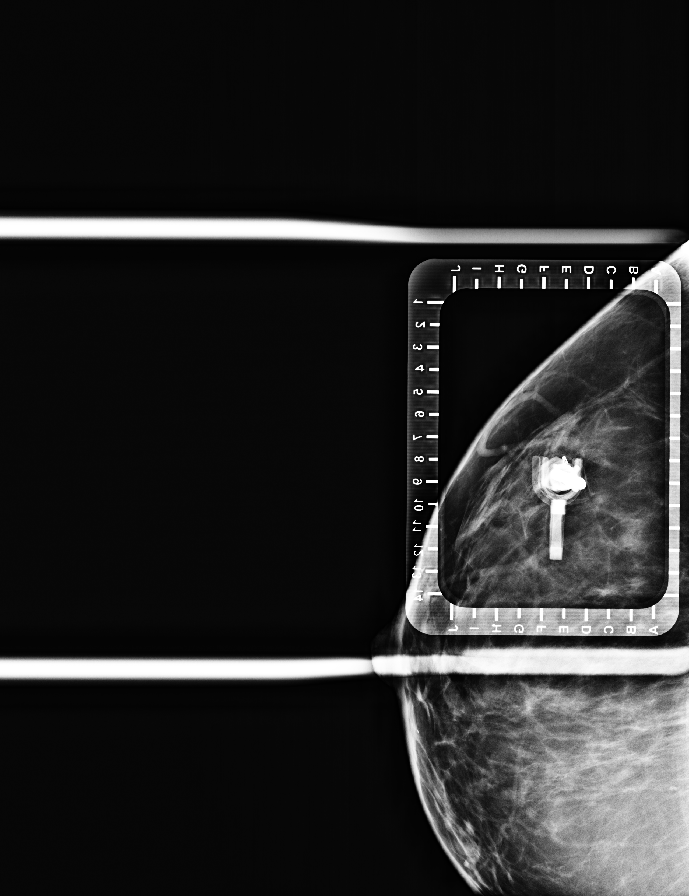

[R CC (5 of 5)]
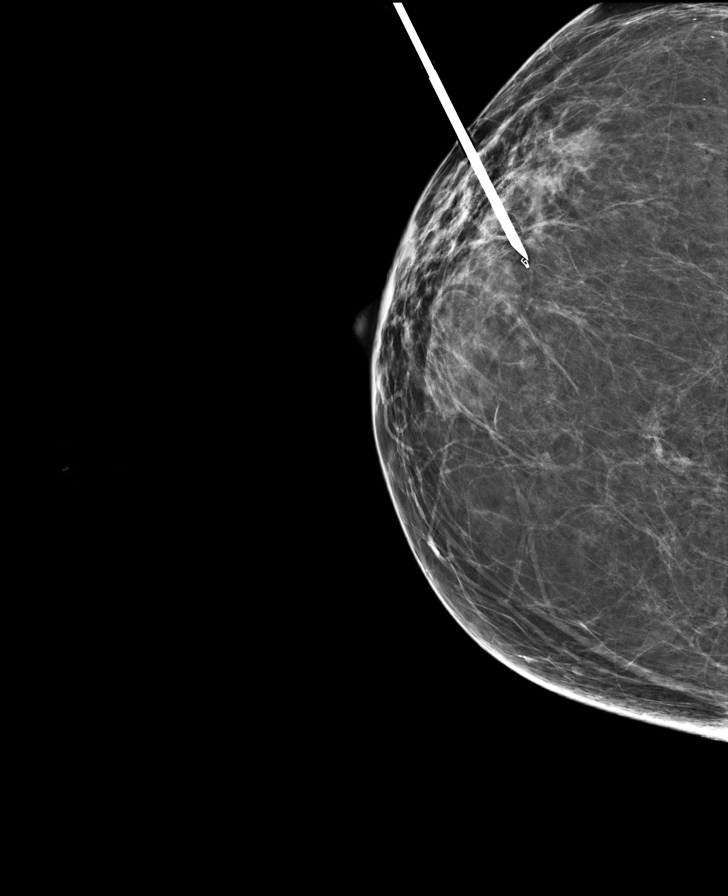

[8 of 8 positions shown; findings below may reference images not displayed]

FINDINGS: Patient presents for radiofrequency device localization prior to
right breast surgery. I met with the patient and we discussed the
procedure of radiofrequency device localization including benefits
and alternatives. We discussed the high likelihood of a successful
procedure. We discussed the risks of the procedure including
infection, bleeding, tissue injury and further surgery. Informed,
written consent was given.

The usual time-out protocol was performed immediately prior to the
procedure.

Using mammographic guidance, sterile technique, 1% lidocaine as
local anesthesia, a radiofrequency tag was used to localize Undag
biopsy clip using a lateral approach.

The follow-up mammogram images confirm that the RF device is in the
expected location and are marked for Dr. Shalley.

Follow-up survey of the patient confirms the presence of the RF
device.

The patient tolerated the procedure well and was released from the
[REDACTED].
IMPRESSION: Radiofrequency device localization of the right breast. No apparent
complications.

## 2022-11-14 IMAGING — MG MM BREAST SURGICAL SPECIMEN
1 series · 1 of 1 positions shown · non-contrast
Comparison: Previous exam(s).

CLINICAL DATA: Status post radiofrequency device localized right
breast lumpectomy.

EXAM:
SPECIMEN RADIOGRAPH OF THE right BREAST

[R]
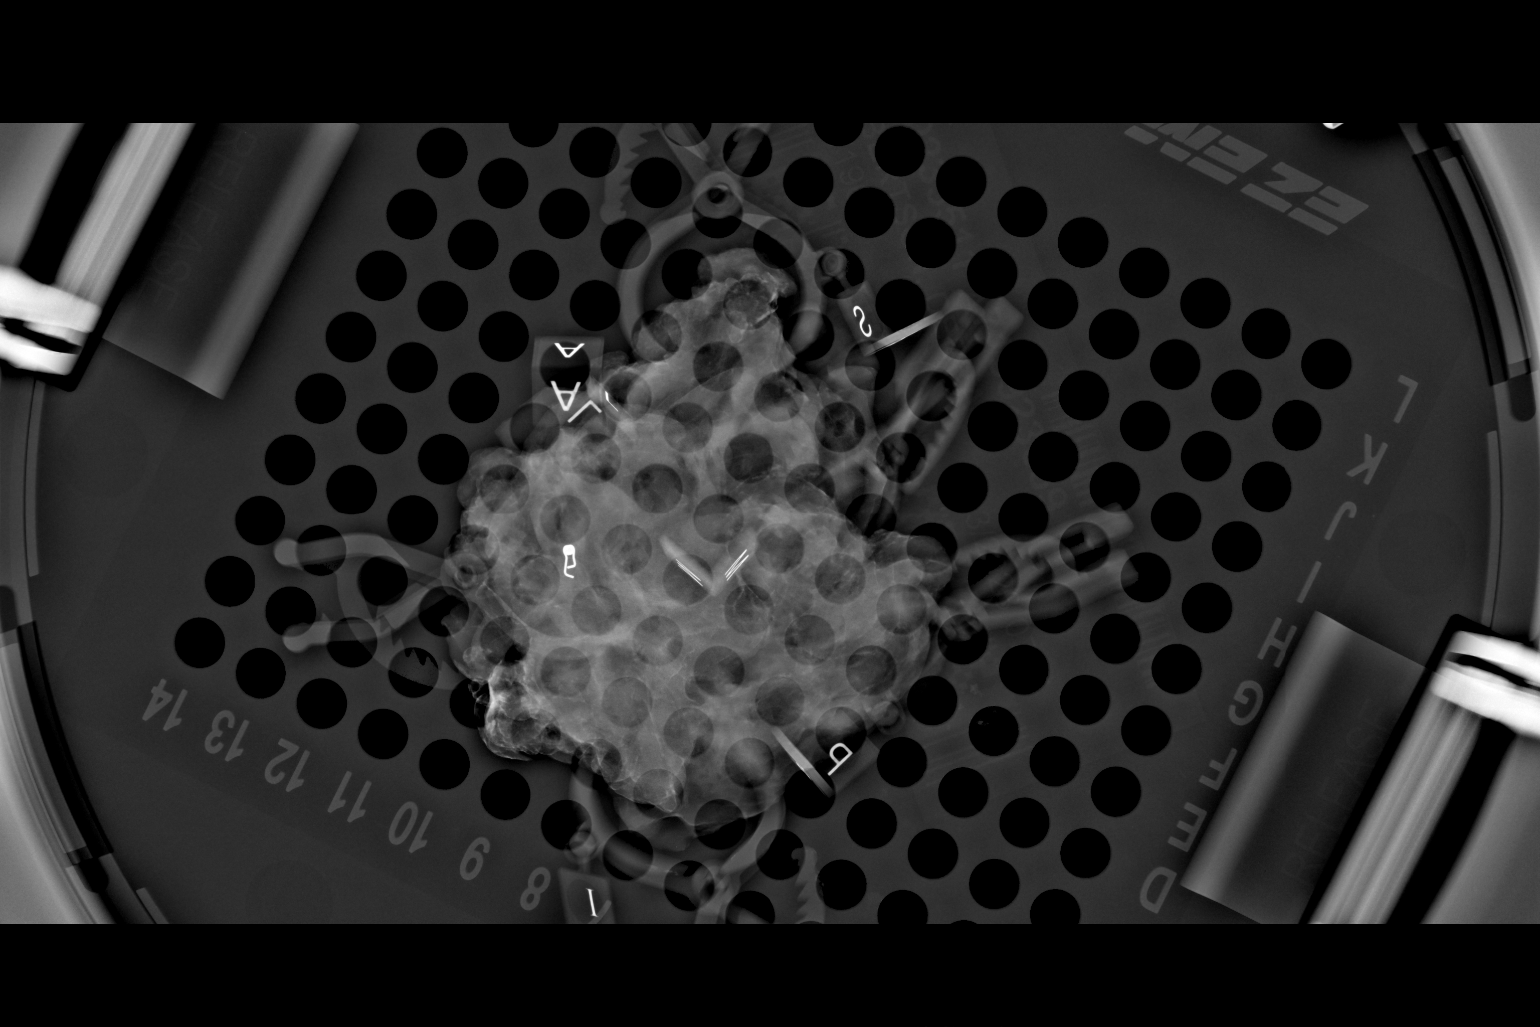

[1 of 1 positions shown; findings below may reference images not displayed]

FINDINGS: Status post excision of the right breast. The radiofrequency device
and If shaped clip are present within the specimen. Please note,
the RF device is located along the margin of the specimen cup.
IMPRESSION: Specimen radiograph of the right breast.

## 2022-12-20 ENCOUNTER — Other Ambulatory Visit: Payer: Self-pay | Admitting: Surgery

## 2022-12-20 DIAGNOSIS — Z1231 Encounter for screening mammogram for malignant neoplasm of breast: Secondary | ICD-10-CM

## 2023-01-22 ENCOUNTER — Ambulatory Visit
Admission: RE | Admit: 2023-01-22 | Discharge: 2023-01-22 | Disposition: A | Discharge status: Home / Self Care | Payer: 59 | Source: Ambulatory Visit | Attending: Surgery | Admitting: Surgery

## 2023-01-22 DIAGNOSIS — Z1231 Encounter for screening mammogram for malignant neoplasm of breast: Secondary | ICD-10-CM | POA: Diagnosis present

## 2024-01-06 ENCOUNTER — Other Ambulatory Visit: Payer: Self-pay | Admitting: Internal Medicine

## 2024-01-06 DIAGNOSIS — Z1231 Encounter for screening mammogram for malignant neoplasm of breast: Secondary | ICD-10-CM

## 2024-01-27 ENCOUNTER — Encounter
